# Patient Record
Sex: Female | Born: 2005 | Race: Black or African American | Hispanic: No | Marital: Single | Smoking: Never smoker
Health system: Southern US, Community
[De-identification: ages and names within clinical notes are randomized; demographics above are authoritative.]

## PROBLEM LIST (undated history)

## (undated) DIAGNOSIS — R198 Other specified symptoms and signs involving the digestive system and abdomen: Secondary | ICD-10-CM

## (undated) DIAGNOSIS — R0981 Nasal congestion: Secondary | ICD-10-CM

## (undated) DIAGNOSIS — R1310 Dysphagia, unspecified: Secondary | ICD-10-CM

## (undated) DIAGNOSIS — R05 Cough: Secondary | ICD-10-CM

## (undated) DIAGNOSIS — E669 Obesity, unspecified: Secondary | ICD-10-CM

## (undated) DIAGNOSIS — E119 Type 2 diabetes mellitus without complications: Secondary | ICD-10-CM

## (undated) DIAGNOSIS — J353 Hypertrophy of tonsils with hypertrophy of adenoids: Secondary | ICD-10-CM

## (undated) HISTORY — DX: Type 2 diabetes mellitus without complications: E11.9

---
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
## 2023-01-27 ENCOUNTER — Telehealth: Payer: Self-pay

## 2023-01-27 NOTE — Telephone Encounter (Signed)
Spoke with patient, reschedule New Patient Appt thru [facility] website @[facility]  for 03/14/2023. I will be calling her tomorrow to make OBGYN New Appt.

## 2023-01-28 ENCOUNTER — Telehealth: Payer: Self-pay

## 2023-01-28 NOTE — Telephone Encounter (Signed)
I had called [facility] before calling patient. Find out that on Fridays the close at noon. Call patient to reschedule a time to call them, we agreed that I would call her Monday to try again and schedule NP appt.

## 2023-02-08 ENCOUNTER — Telehealth: Payer: Self-pay

## 2023-02-08 NOTE — Telephone Encounter (Signed)
Attempted to call patient to let her know I was able to make a New Patient Appt at [facility] for her OBGYN Care. Appt on 03/15/2023 at 3:10 pm with Dr. Charlotta Newton (female)

## 2023-02-08 NOTE — Telephone Encounter (Signed)
Patient called back, notify her that I was able to make a New Patient Appt at [facility] for her OBGYN Care with Dr. Myna Hidalgo on 03/15/2023 at 3:10 pm. I informed the importance of this appt., that since this is the appt that will establish her care with Dr. Charlotta Newton so that whenever she has questions, health concerns, or any symptom that she can consult them thru MyChart or by phone. Patient agree and stress her interest in keeping both appt. Pt was very thankful and agree to received a follow up call in two weeks.

## 2023-02-28 ENCOUNTER — Emergency Department (HOSPITAL_COMMUNITY)
Admission: EM | Admit: 2023-02-28 | Discharge: 2023-03-01 | Disposition: A | Discharge status: Home / Self Care | Payer: Medicaid Other | Attending: Emergency Medicine | Admitting: Emergency Medicine

## 2023-02-28 ENCOUNTER — Other Ambulatory Visit: Payer: Self-pay

## 2023-02-28 ENCOUNTER — Ambulatory Visit
Admission: EM | Admit: 2023-02-28 | Discharge: 2023-02-28 | Disposition: A | Discharge status: Home / Self Care | Payer: Medicaid Other | Attending: Nurse Practitioner | Admitting: Nurse Practitioner

## 2023-02-28 DIAGNOSIS — E119 Type 2 diabetes mellitus without complications: Secondary | ICD-10-CM | POA: Insufficient documentation

## 2023-02-28 DIAGNOSIS — Z7984 Long term (current) use of oral hypoglycemic drugs: Secondary | ICD-10-CM | POA: Diagnosis not present

## 2023-02-28 DIAGNOSIS — I880 Nonspecific mesenteric lymphadenitis: Secondary | ICD-10-CM

## 2023-02-28 DIAGNOSIS — R109 Unspecified abdominal pain: Secondary | ICD-10-CM

## 2023-02-28 DIAGNOSIS — N3289 Other specified disorders of bladder: Secondary | ICD-10-CM | POA: Diagnosis not present

## 2023-02-28 LAB — POCT URINALYSIS DIP (MANUAL ENTRY)
Bilirubin, UA: NEGATIVE
Blood, UA: NEGATIVE
Glucose, UA: NEGATIVE mg/dL
Ketones, POC UA: NEGATIVE mg/dL
Leukocytes, UA: NEGATIVE
Nitrite, UA: NEGATIVE
Protein Ur, POC: NEGATIVE mg/dL
Spec Grav, UA: >=1.03 — AB
Urobilinogen, UA: 0.2 U/dL
pH, UA: 6

## 2023-02-28 NOTE — ED Provider Notes (Signed)
[facility]    CSN: [id number] Arrival date & time: 02/28/23  1824      History   Chief Complaint Chief Complaint  Patient presents with   Abdominal Pain    HPI Christina Dixon is a 17 y.o. female.   Patient presents today for pain in her right lower quadrant this been ongoing since this morning when she bent down to pick up a rag in the shower.  Reports the pain is severe and constant.  She has been getting her hair done all day and had to frequently stand up and move around to help with the belly pain.  She reports the belly pain has worsened throughout the day and is currently a 10 out of 10, worst pain of her life.  She reports only time she had similar pain is when she had acute appendicitis and had to have her appendix removed.    Past Medical History:  Diagnosis Date   Cough 05/28/2014   Diabetes mellitus without complication (HCC)    diet controlled   Difficulty swallowing pills    Obesity    Stuffy nose 05/28/2014   Tonsillar and adenoid hypertrophy 05/2014   snores during sleep, occasionally stops breathing, per mother    Patient Active Problem List   Diagnosis Date Noted   Irregular menstrual cycle 11/05/2019   S/P laparoscopic appendectomy 08/08/2017   Acute appendicitis    Prediabetes 01/24/2015   Adjustment disorder with depressed mood 11/21/2014   Severe obesity due to excess calories without serious comorbidity with body mass index (BMI) greater than 99th percentile for age in pediatric patient (HCC) 10/17/2014   Acanthosis nigricans 10/17/2014   Dyspepsia 10/17/2014   Insulin resistance 10/17/2014   Mixed hyperlipidemia 10/17/2014   Hyperinsulinemia 10/04/2014    Past Surgical History:  Procedure Laterality Date   LAPAROSCOPIC APPENDECTOMY N/A 08/08/2017   Procedure: APPENDECTOMY LAPAROSCOPIC;  Surgeon: Franky Macho, MD;  Location: [facility];  Service: General;  Laterality: N/A;   TONSILLECTOMY AND ADENOIDECTOMY Bilateral 06/04/2014    Procedure: BILATERAL TONSILLECTOMY AND ADENOIDECTOMY;  Surgeon: Newman Pies, MD;  Location: [facility];  Service: ENT;  Laterality: Bilateral;    OB History   No obstetric history on file.      Home Medications    Prior to Admission medications   Medication Sig Start Date End Date Taking? Authorizing Provider  chlorhexidine (HIBICLENS) 4 % external liquid Apply topically daily as needed. Patient not taking: Reported on 03/26/2022 12/17/20   Particia Nearing, PA-C  cyclobenzaprine (FLEXERIL) 10 MG tablet Take 1 tablet (10 mg total) by mouth 2 (two) times daily as needed for muscle spasms. Patient not taking: Reported on 03/26/2022 07/01/20   Wurst, [place], PA-C  doxycycline (VIBRA-TABS) 100 MG tablet Take 1 tablet (100 mg total) by mouth 2 (two) times daily. 06/27/22   Kommor, [place], MD  guaiFENesin-dextromethorphan [facility] DM) 100-10 MG/5ML syrup Take 5 mLs by mouth every 4 (four) hours as needed for cough. Patient not taking: Reported on 03/26/2022 03/20/22   Gareth Eagle, PA-C  meloxicam (MOBIC) 15 MG tablet Take 1 tablet (15 mg total) by mouth daily. Patient not taking: Reported on 03/26/2022 07/01/20   Wurst, [place], PA-C  metFORMIN (GLUCOPHAGE) 500 MG tablet Take 1 tablet (500 mg total) by mouth 2 (two) times daily with a meal. 12/19/19 01/18/20  Vella Kohler, MD  metroNIDAZOLE (FLAGYL) 500 MG tablet Take 1 tablet (500 mg total) by mouth 2 (two) times daily. 06/27/22  Kommor, [place], MD    Family History Family History  Problem Relation Age of Onset   Diabetes Father    Hypertension Father    Asthma Father    Seizures Father    Hypertension Maternal Grandmother    Hypertension Maternal Grandfather    Diabetes Paternal Grandfather    Hypertension Paternal Grandfather    Depression Mother    Anxiety disorder Mother    Hypertension Paternal Grandmother    Asthma Brother    Migraines Brother    Migraines Maternal Aunt    Bipolar disorder Neg Hx     Schizophrenia Neg Hx    ADD / ADHD Neg Hx    Autism Neg Hx     Social History Social History   Tobacco Use   Smoking status: Passive Smoke Exposure - Never Smoker   Smokeless tobacco: Never   Tobacco comments:    mother smokes outside most of the time  Vaping Use   Vaping status: Never Used  Substance Use Topics   Alcohol use: No   Drug use: No     Allergies   Patient has no known allergies.   Review of Systems Review of Systems Per HPI  Physical Exam Triage Vital Signs ED Triage Vitals  Encounter Vitals Group     BP 02/28/23 1910 112/72     Systolic BP Percentile --      Diastolic BP Percentile --      Pulse Rate 02/28/23 1910 (!) 59     Resp 02/28/23 1910 16     Temp 02/28/23 1910 98.1 F (36.7 C)     Temp Source 02/28/23 1910 Oral     SpO2 02/28/23 1910 98 %     Weight --      Height --      Head Circumference --      Peak Flow --      Pain Score 02/28/23 1911 10     Pain Loc --      Pain Education --      Exclude from Growth Chart --    No data found.  Updated Vital Signs BP 112/72 (BP Location: Right Arm)   Pulse (!) 59   Temp 98.1 F (36.7 C) (Oral)   Resp 16   LMP 02/24/2023 (Exact Date)   SpO2 98%   Visual Acuity Right Eye Distance:   Left Eye Distance:   Bilateral Distance:    Right Eye Near:   Left Eye Near:    Bilateral Near:     Physical Exam Vitals and nursing note reviewed.  Constitutional:      General: She is not in acute distress.    Appearance: Normal appearance. She is not toxic-appearing.  HENT:     Head: Normocephalic and atraumatic.     Mouth/Throat:     Mouth: Mucous membranes are moist.     Pharynx: Oropharynx is clear.  Eyes:     General: No scleral icterus.    Extraocular Movements: Extraocular movements intact.  Cardiovascular:     Rate and Rhythm: Bradycardia present.  Pulmonary:     Effort: Pulmonary effort is normal. No respiratory distress.     Breath sounds: Normal breath sounds. No rhonchi.   Abdominal:     General: Bowel sounds are normal.  Musculoskeletal:     Cervical back: Normal range of motion.  Lymphadenopathy:     Cervical: No cervical adenopathy.  Skin:    General: Skin is warm and dry.  Capillary Refill: Capillary refill takes less than 2 seconds.     Coloration: Skin is not jaundiced or pale.     Findings: No erythema.  Neurological:     Mental Status: She is alert and oriented to person, place, and time.  Psychiatric:        Mood and Affect: Affect is tearful.        Behavior: Behavior is cooperative.    Examination abbreviated as decision was made for patient to be seen emergently.  UC Treatments / Results  Labs (all labs ordered are listed, but only abnormal results are displayed) Labs Reviewed  POCT URINALYSIS DIP (MANUAL ENTRY) - Abnormal; Notable for the following components:      Result Value   Spec Grav, UA >=1.030 (*)    All other components within normal limits    EKG   Radiology No results found.  Procedures Procedures (including critical care time)  Medications Ordered in UC Medications - No data to display  Initial Impression / Assessment and Plan / UC Course  I have reviewed the triage vital signs and the nursing notes.  Pertinent labs & imaging results that were available during my care of the patient were reviewed by me and considered in my medical decision making (see chart for details).   Patient is well-appearing, normotensive, afebrile, not tachycardic, not tachypneic, oxygenating well on room air.    1. Sudden onset of severe abdominal pain Overall, vitals are stable Given severity of pain worsening throughout the day, I recommended further evaluation and management in emergency room Urinalysis is negative for signs of bladder or kidney stone Patient is in agreement to plan She is safe to transport via private vehicle at this time  The patient was given the opportunity to ask questions.  All questions answered to  their satisfaction.  The patient is in agreement to this plan.   Final Clinical Impressions(s) / UC Diagnoses   Final diagnoses:  Sudden onset of severe abdominal pain     Discharge Instructions      Please go directly to the emergency room for further evaluation and management of your abdominal pain.    ED Prescriptions   None    PDMP not reviewed this encounter.   Valentino Nose, NP 02/28/23 [phone]

## 2023-02-28 NOTE — Discharge Instructions (Signed)
Please go directly to the emergency room for further evaluation and management of your abdominal pain.

## 2023-02-28 NOTE — ED Notes (Signed)
Patient is being discharged from the Urgent Care and sent to the Emergency Department via [facility] . Per provider Anderson Malta., patient is in need of higher level of care due to abdominal pain needing further testing. Patient is aware and verbalizes understanding of plan of care.  Vitals:   02/28/23 1910  BP: 112/72  Pulse: (!) 59  Resp: 16  Temp: 98.1 F (36.7 C)  SpO2: 98%

## 2023-02-28 NOTE — ED Triage Notes (Signed)
Pt states she is having pain in RLQ the goes to her back that started this morning. Pt states the pain is worse now and having nausea and vomiting. Taking tylenol and ibuprofen.

## 2023-02-28 NOTE — ED Triage Notes (Signed)
Pt c/o right flank pain that began this morning, states the pain has gotten worse and now having N/V. Was seen at [facility] earlier tonight and was instructed to come to ED to be evaluated.

## 2023-03-01 ENCOUNTER — Emergency Department (HOSPITAL_COMMUNITY): Payer: Medicaid Other

## 2023-03-01 DIAGNOSIS — N3289 Other specified disorders of bladder: Secondary | ICD-10-CM | POA: Diagnosis not present

## 2023-03-01 DIAGNOSIS — R109 Unspecified abdominal pain: Secondary | ICD-10-CM | POA: Diagnosis not present

## 2023-03-01 LAB — URINALYSIS, ROUTINE W REFLEX MICROSCOPIC
Bilirubin Urine: NEGATIVE
Glucose, UA: NEGATIVE mg/dL
Hgb urine dipstick: NEGATIVE
Ketones, ur: NEGATIVE mg/dL
Nitrite: NEGATIVE
Protein, ur: 30 mg/dL — AB
Specific Gravity, Urine: 1.03 (ref 1.005–1.030)
pH: 5 (ref 5.0–8.0)

## 2023-03-01 LAB — BASIC METABOLIC PANEL
Anion gap: 9 (ref 5–15)
BUN: 13 mg/dL (ref 6–20)
CO2: 24 mmol/L (ref 22–32)
Calcium: 8.6 mg/dL — ABNORMAL LOW (ref 8.9–10.3)
Chloride: 105 mmol/L (ref 98–111)
Creatinine, Ser: 1.14 mg/dL — ABNORMAL HIGH (ref 0.44–1.00)
GFR, Estimated: >60 mL/min (ref >60)
Glucose, Bld: 98 mg/dL (ref 70–99)
Potassium: 3.9 mmol/L (ref 3.5–5.1)
Sodium: 138 mmol/L (ref 135–145)

## 2023-03-01 LAB — CBC
HCT: 43.1 % (ref 36.0–46.0)
Hemoglobin: 13.9 g/dL (ref 12.0–15.0)
MCH: 28.6 pg (ref 26.0–34.0)
MCHC: 32.3 g/dL (ref 30.0–36.0)
MCV: 88.7 fL (ref 80.0–100.0)
Platelets: 257 10*3/uL (ref 150–400)
RBC: 4.86 MIL/uL (ref 3.87–5.11)
RDW: 13.1 % (ref 11.5–15.5)
WBC: 8.4 10*3/uL (ref 4.0–10.5)
nRBC: 0 % (ref 0.0–0.2)

## 2023-03-01 LAB — PREGNANCY, URINE: Preg Test, Ur: NEGATIVE

## 2023-03-01 MED ORDER — HYDROCODONE-ACETAMINOPHEN 5-325 MG PO TABS
1.0000 | ORAL_TABLET | Freq: Once | ORAL | Status: AC
  Administered 2023-03-01: 1 via ORAL
  Filled 2023-03-01: qty 1

## 2023-03-01 MED ORDER — HYDROCODONE-ACETAMINOPHEN 5-325 MG PO TABS: 1.0000 | ORAL_TABLET | Freq: Four times a day (QID) | ORAL | 0 refills | Status: DC | PRN

## 2023-03-01 MED ORDER — ONDANSETRON 8 MG PO TBDP
8.0000 mg | ORAL_TABLET | Freq: Once | ORAL | Status: AC
  Administered 2023-03-01: 8 mg via ORAL
  Filled 2023-03-01: qty 1

## 2023-03-01 MED ORDER — KETOROLAC TROMETHAMINE 15 MG/ML IJ SOLN
15.0000 mg | Freq: Once | INTRAMUSCULAR | Status: AC
  Administered 2023-03-01: 15 mg via INTRAVENOUS
  Filled 2023-03-01: qty 1

## 2023-03-01 NOTE — ED Notes (Signed)
ED Provider at bedside. 

## 2023-03-01 NOTE — ED Provider Notes (Signed)
[place] [facility] AT [facility] Provider Note   CSN: [id number] Arrival date & time: 02/28/23  2338     History  Chief Complaint  Patient presents with   Flank Pain    Christina Dixon is a 17 y.o. female.  The history is provided by the patient.  Flank Pain  Patient with history of prediabetes and obesity presents with flank and abdominal pain.  Patient reports this started in the morning & has been worsening.  It is in the right lower quadrant and into her right flank.  She reports that is very severe.  She has had episodes of nausea vomiting and diarrhea.  She reports dysuria.  No vaginal bleeding or discharge.  Previous history of appendectomy    Past Medical History:  Diagnosis Date   Cough 05/28/2014   Diabetes mellitus without complication (HCC)    diet controlled   Difficulty swallowing pills    Obesity    Stuffy nose 05/28/2014   Tonsillar and adenoid hypertrophy 05/2014   snores during sleep, occasionally stops breathing, per mother    Home Medications Prior to Admission medications   Medication Sig Start Date End Date Taking? Authorizing Provider  HYDROcodone-acetaminophen (NORCO/VICODIN) 5-325 MG tablet Take 1 tablet by mouth every 6 (six) hours as needed. 03/01/23  Yes Zadie Rhine, MD  metFORMIN (GLUCOPHAGE) 500 MG tablet Take 1 tablet (500 mg total) by mouth 2 (two) times daily with a meal. 12/19/19 01/18/20  Vella Kohler, MD      Allergies    Patient has no known allergies.    Review of Systems   Review of Systems  Constitutional:  Negative for fever.  Gastrointestinal:  Positive for diarrhea, nausea and vomiting.  Genitourinary:  Positive for dysuria and flank pain. Negative for vaginal bleeding and vaginal discharge.    Physical Exam Updated Vital Signs BP 108/64   Pulse 63   Temp 97.6 F (36.4 C) (Oral)   Resp 18   LMP 02/24/2023 (Exact Date)   SpO2 100%  Physical Exam CONSTITUTIONAL: Well developed/well  nourished HEAD: Normocephalic/atraumatic EYES: EOMI ENMT: Mucous membranes moist NECK: supple no meningeal signs CV: S1/S2 noted, no murmurs/rubs/gallops noted LUNGS: Lungs are clear to auscultation bilaterally, no apparent distress ABDOMEN: soft, mild RLQ tenderness, no rebound or guarding, bowel sounds noted throughout abdomen GU: Right cva tenderness NEURO: Pt is awake/alert/appropriate, moves all extremitiesx4.  No facial droop.   EXTREMITIES: pulses normal/equal, full ROM SKIN: warm, color normal PSYCH: no abnormalities of mood noted, alert and oriented to situation  ED Results / Procedures / Treatments   Labs (all labs ordered are listed, but only abnormal results are displayed) Labs Reviewed  BASIC METABOLIC PANEL - Abnormal; Notable for the following components:      Result Value   Creatinine, Ser 1.14 (*)    Calcium 8.6 (*)    All other components within normal limits  URINALYSIS, ROUTINE W REFLEX MICROSCOPIC - Abnormal; Notable for the following components:   APPearance TURBID (*)    Protein, ur 30 (*)    Leukocytes,Ua TRACE (*)    Bacteria, UA MANY (*)    All other components within normal limits  CBC  PREGNANCY, URINE    EKG None  Radiology CT Renal Stone Study Result Date: 03/01/2023 CLINICAL DATA:  Right flank pain. EXAM: CT ABDOMEN AND PELVIS WITHOUT CONTRAST TECHNIQUE: Multidetector CT imaging of the abdomen and pelvis was performed following the standard protocol without IV contrast. RADIATION DOSE REDUCTION:  This exam was performed according to the departmental dose-optimization program which includes automated exposure control, adjustment of the mA and/or kV according to patient size and/or use of iterative reconstruction technique. COMPARISON:  August 08, 2017 FINDINGS: Lower chest: No acute abnormality. Hepatobiliary: No focal liver abnormality is seen. No gallstones, gallbladder wall thickening, or biliary dilatation. Pancreas: Unremarkable. No pancreatic  ductal dilatation or surrounding inflammatory changes. Spleen: Normal in size without focal abnormality. Adrenals/Urinary Tract: Adrenal glands are unremarkable. Kidneys are normal, without renal calculi, focal lesion, or hydronephrosis. The urinary bladder is poorly distended and subsequently limited in evaluation. Stomach/Bowel: Stomach is within normal limits. The appendix is surgically absent. No evidence of bowel wall thickening, distention, or inflammatory changes. Vascular/Lymphatic: No significant vascular findings are present. Numerous tiny mesenteric lymph nodes are seen scattered throughout the abdomen. Reproductive: Uterus and bilateral adnexa are unremarkable. Other: No abdominal wall hernia or abnormality. No abdominopelvic ascites. Musculoskeletal: No acute or significant osseous findings. IMPRESSION: 1. Numerous tiny mesenteric lymph nodes scattered throughout the abdomen, which may represent sequelae associated with mesenteric adenitis. 2. Evidence of prior appendectomy. Electronically Signed   By: Aram Candela M.D.   On: 03/01/2023 02:13    Procedures Procedures    Medications Ordered in ED Medications  ketorolac (TORADOL) 15 MG/ML injection 15 mg (has no administration in time range)  ondansetron (ZOFRAN-ODT) disintegrating tablet 8 mg (8 mg Oral Given 03/01/23 0030)  HYDROcodone-acetaminophen (NORCO/VICODIN) 5-325 MG per tablet 1 tablet (1 tablet Oral Given 03/01/23 0030)    ED Course/ Medical Decision Making/ A&P Clinical Course as of 03/01/23 0257  Tue Mar 01, 2023  0256 Patient presented for flank and abdominal pain.  Overall labs were unremarkable CT imaging reveals mesenteric adenitis.  Patient has been resting comfortably. Patient safe for discharge home.  We discussed CT findings and will provide short course of pain medicine [DW]    Clinical Course User Index [DW] Zadie Rhine, MD                                 Medical Decision Making Amount and/or  Complexity of Data Reviewed Labs: ordered. Radiology: ordered.  Risk Prescription drug management.   This patient presents to the ED for concern of flank and abdominal pain, this involves an extensive number of treatment options, and is a complaint that carries with it a high risk of complications and morbidity.  The differential diagnosis includes but is not limited to cholecystitis, cholelithiasis, pancreatitis, gastritis, peptic ulcer disease, , bowel obstruction, bowel perforation, diverticulitis, TOA, PID, ectopic pregnancy, kidney stone, UTI    Comorbidities that complicate the patient evaluation: Patient's presentation is complicated by their history of obesity and diabetes  Additional history obtained: Records reviewed  urgent care notes reviewed  Lab Tests: I Ordered, and personally interpreted labs.  The pertinent results include: Labs overall unremarkable  Imaging Studies ordered: I ordered imaging studies including CT scan renal   I independently visualized and interpreted imaging which showed mesenteric adenitis I agree with the radiologist interpretation   Medicines ordered and prescription drug management: I ordered medication including Vicodin for pain Reevaluation of the patient after these medicines showed that the patient    improved   Reevaluation: After the interventions noted above, I reevaluated the patient and found that they have :improved  Complexity of problems addressed: Patient's presentation is most consistent with  acute presentation with potential threat to life or bodily function  Disposition: After consideration of the diagnostic results and the patient's response to treatment,  I feel that the patent would benefit from discharge   .           Final Clinical Impression(s) / ED Diagnoses Final diagnoses:  Mesenteric adenitis    Rx / DC Orders ED Discharge Orders          Ordered    HYDROcodone-acetaminophen (NORCO/VICODIN)  5-325 MG tablet  Every 6 hours PRN        03/01/23 0255              Zadie Rhine, MD 03/01/23 [phone]

## 2023-03-14 ENCOUNTER — Ambulatory Visit: Payer: Medicaid Other | Admitting: Family Medicine

## 2023-03-15 ENCOUNTER — Encounter: Payer: Medicaid Other | Admitting: Obstetrics & Gynecology

## 2023-03-25 ENCOUNTER — Ambulatory Visit
Admission: EM | Admit: 2023-03-25 | Discharge: 2023-03-25 | Disposition: A | Discharge status: Home / Self Care | Payer: Medicaid Other | Attending: Nurse Practitioner | Admitting: Nurse Practitioner

## 2023-03-25 ENCOUNTER — Encounter: Payer: Self-pay | Admitting: Family Medicine

## 2023-03-25 ENCOUNTER — Emergency Department (HOSPITAL_COMMUNITY)
Admission: EM | Admit: 2023-03-25 | Discharge: 2023-03-25 | Discharge status: Against Medical Advice | Payer: Medicaid Other | Source: Home / Self Care

## 2023-03-25 DIAGNOSIS — M25562 Pain in left knee: Secondary | ICD-10-CM | POA: Diagnosis not present

## 2023-03-25 LAB — POCT URINE PREGNANCY: Preg Test, Ur: NEGATIVE

## 2023-03-25 NOTE — ED Notes (Addendum)
Urine sample obtained. POC Urinalysis order entered by clinical staff in error. POC Urinalysis not completed and order discontinued. Provider aware and give verbal order for POC urine pregnancy only.

## 2023-03-25 NOTE — Discharge Instructions (Signed)
Go to [facility] for an x-ray of your left knee.You will be contacted when the results are received.  May take Ibuprofen or Tylenol as needed for pain or discomfort. RICE therapy, rest, ice, compression and elevation. Apply ice for 20 minutes, remove for 1 hr, repeat as needed. If the xray is negative and you care continuing to have pain in your knee, it is recommended you follow-up with orthopedics for further evaluation.  Follow-up as needed.

## 2023-03-25 NOTE — ED Triage Notes (Signed)
Pt reports she turned the wrong way and heard her left leg make a sound and couldn't move it x 3 days

## 2023-03-25 NOTE — ED Provider Notes (Signed)
[facility]    CSN: [id number] Arrival date & time: 03/25/23  1155      History   Chief Complaint No chief complaint on file.   HPI Christina Dixon is a 18 y.o. female.   The history is provided by the patient.   Patient presents for complaints of left knee pain has been present for the past 3 days.  She states that she turned the wrong way and heard a "pop" in her knee.  She states that she has had difficulty moving the leg as she has pain when she straightens the leg and when she bends it.  She states that when she turns the leg a certain way, she has pain as well.  Pain is located on the outside of the left knee.  She denies redness, swelling, bruising, or the inability to ambulate.  Patient states that the pain has started radiating down into her right lower leg.  She states that she did take ibuprofen with minimal relief of her symptoms.  Past Medical History:  Diagnosis Date   Cough 05/28/2014   Diabetes mellitus without complication (HCC)    diet controlled   Difficulty swallowing pills    Obesity    Stuffy nose 05/28/2014   Tonsillar and adenoid hypertrophy 05/2014   snores during sleep, occasionally stops breathing, per mother    Patient Active Problem List   Diagnosis Date Noted   Irregular menstrual cycle 11/05/2019   S/P laparoscopic appendectomy 08/08/2017   Acute appendicitis    Prediabetes 01/24/2015   Adjustment disorder with depressed mood 11/21/2014   Severe obesity due to excess calories without serious comorbidity with body mass index (BMI) greater than 99th percentile for age in pediatric patient (HCC) 10/17/2014   Acanthosis nigricans 10/17/2014   Dyspepsia 10/17/2014   Insulin resistance 10/17/2014   Mixed hyperlipidemia 10/17/2014   Hyperinsulinemia 10/04/2014    Past Surgical History:  Procedure Laterality Date   LAPAROSCOPIC APPENDECTOMY N/A 08/08/2017   Procedure: APPENDECTOMY LAPAROSCOPIC;  Surgeon: Franky Macho, MD;  Location:  [facility];  Service: General;  Laterality: N/A;   TONSILLECTOMY AND ADENOIDECTOMY Bilateral 06/04/2014   Procedure: BILATERAL TONSILLECTOMY AND ADENOIDECTOMY;  Surgeon: Newman Pies, MD;  Location: [facility];  Service: ENT;  Laterality: Bilateral;    OB History   No obstetric history on file.      Home Medications    Prior to Admission medications   Medication Sig Start Date End Date Taking? Authorizing Provider  HYDROcodone-acetaminophen (NORCO/VICODIN) 5-325 MG tablet Take 1 tablet by mouth every 6 (six) hours as needed. 03/01/23   Zadie Rhine, MD  metFORMIN (GLUCOPHAGE) 500 MG tablet Take 1 tablet (500 mg total) by mouth 2 (two) times daily with a meal. 12/19/19 01/18/20  Vella Kohler, MD    Family History Family History  Problem Relation Age of Onset   Diabetes Father    Hypertension Father    Asthma Father    Seizures Father    Hypertension Maternal Grandmother    Hypertension Maternal Grandfather    Diabetes Paternal Grandfather    Hypertension Paternal Grandfather    Depression Mother    Anxiety disorder Mother    Hypertension Paternal Grandmother    Asthma Brother    Migraines Brother    Migraines Maternal Aunt    Bipolar disorder Neg Hx    Schizophrenia Neg Hx    ADD / ADHD Neg Hx    Autism Neg Hx     Social  History Social History   Tobacco Use   Smoking status: Passive Smoke Exposure - Never Smoker   Smokeless tobacco: Never   Tobacco comments:    mother smokes outside most of the time  Vaping Use   Vaping status: Never Used  Substance Use Topics   Alcohol use: No   Drug use: No     Allergies   Patient has no known allergies.   Review of Systems Review of Systems Per HPI  Physical Exam Triage Vital Signs ED Triage Vitals [03/25/23 1252]  Encounter Vitals Group     BP 100/70     Systolic BP Percentile      Diastolic BP Percentile      Pulse Rate 81     Resp 20     Temp 98.3 F (36.8 C)     Temp Source Oral     SpO2  94 %     Weight      Height      Head Circumference      Peak Flow      Pain Score 6     Pain Loc      Pain Education      Exclude from Growth Chart    No data found.  Updated Vital Signs BP 100/70 (BP Location: Right Arm)   Pulse 81   Temp 98.3 F (36.8 C) (Oral)   Resp 20   LMP 03/23/2023 (Exact Date)   SpO2 94%   Visual Acuity Right Eye Distance:   Left Eye Distance:   Bilateral Distance:    Right Eye Near:   Left Eye Near:    Bilateral Near:     Physical Exam Vitals and nursing note reviewed.  Constitutional:      General: She is not in acute distress.    Appearance: Normal appearance.  HENT:     Head: Normocephalic.  Eyes:     Extraocular Movements: Extraocular movements intact.     Pupils: Pupils are equal, round, and reactive to light.  Pulmonary:     Effort: Pulmonary effort is normal.  Musculoskeletal:     Cervical back: Normal range of motion.     Left knee: No swelling, deformity, effusion, erythema, ecchymosis or bony tenderness. Decreased range of motion. Tenderness present over the lateral joint line. Normal pulse.  Skin:    General: Skin is warm.  Neurological:     General: No focal deficit present.     Mental Status: She is alert and oriented to person, place, and time.  Psychiatric:        Mood and Affect: Mood normal.        Behavior: Behavior normal.      UC Treatments / Results  Labs (all labs ordered are listed, but only abnormal results are displayed) Labs Reviewed  POCT URINE PREGNANCY    EKG   Radiology No results found.  Procedures Procedures (including critical care time)  Medications Ordered in UC Medications - No data to display  Initial Impression / Assessment and Plan / UC Course  I have reviewed the triage vital signs and the nursing notes.  Pertinent labs & imaging results that were available during my care of the patient were reviewed by me and considered in my medical decision making (see chart for  details).  X-ray of the left knee is pending.  Patient to go to [facility] for imaging.  Cannot rule out meniscal injury given the patient's current symptoms, did advise patient that  she would need further imaging such as an MRI for a true determination.  Will rule out fracture or dislocation at this time.  Hinged knee brace was provided to the patient to provide additional support and stabilization of the left lower extremity.  Supportive care recommendations were provided and discussed with the patient to.  RICE therapy, and over-the-counter analgesics.  Advised patient if symptoms continue to persist despite a negative x-ray, recommend that she follow-up with orthopedics for further evaluation.  Patient was in agreement with this plan of care and verbalized understanding.  All questions were answered.  Patient stable for discharge.   Final Clinical Impressions(s) / UC Diagnoses   Final diagnoses:  Left knee pain, unspecified chronicity     Discharge Instructions      Go to [facility] for an x-ray of your left knee.You will be contacted when the results are received.  May take Ibuprofen or Tylenol as needed for pain or discomfort. RICE therapy, rest, ice, compression and elevation. Apply ice for 20 minutes, remove for 1 hr, repeat as needed. If the xray is negative and you care continuing to have pain in your knee, it is recommended you follow-up with orthopedics for further evaluation.  Follow-up as needed.      ED Prescriptions   None    PDMP not reviewed this encounter.   Abran Cantor, NP 03/25/23 1342

## 2023-04-05 ENCOUNTER — Ambulatory Visit: Payer: Medicaid Other | Admitting: Obstetrics & Gynecology

## 2023-04-05 ENCOUNTER — Other Ambulatory Visit (HOSPITAL_COMMUNITY)
Admission: RE | Admit: 2023-04-05 | Discharge: 2023-04-05 | Disposition: A | Discharge status: Home / Self Care | Payer: Medicaid Other | Source: Ambulatory Visit | Attending: Obstetrics & Gynecology | Admitting: Obstetrics & Gynecology

## 2023-04-05 ENCOUNTER — Encounter: Payer: Self-pay | Admitting: Obstetrics & Gynecology

## 2023-04-05 VITALS — BP 97/58 | HR 71 | Ht 68.0 in | Wt 324.0 lb

## 2023-04-05 DIAGNOSIS — R7303 Prediabetes: Secondary | ICD-10-CM

## 2023-04-05 DIAGNOSIS — Z113 Encounter for screening for infections with a predominantly sexual mode of transmission: Secondary | ICD-10-CM | POA: Insufficient documentation

## 2023-04-05 DIAGNOSIS — Z6841 Body Mass Index (BMI) 40.0 and over, adult: Secondary | ICD-10-CM | POA: Diagnosis not present

## 2023-04-05 DIAGNOSIS — Z3009 Encounter for other general counseling and advice on contraception: Secondary | ICD-10-CM

## 2023-04-05 NOTE — Progress Notes (Signed)
   GYN VISIT Patient name: Christina Dixon MRN [id number]  Date of birth: [birth year] Chief Complaint:   Establish Care  History of Present Illness:   Christina Dixon is a 18 y.o. G0P0000 female being seen today to establish care and the following concerns.     Menses are every month- last for about 5 days.  Denies intermenstrual bleeding.  Sexually active with same partner for the past couple months and desires a pregnancy.  She does not report a pregnancy in the past  ?PID- h/o Trich and Chlamydia.  She was treated then tested positive again for trichomonas and retreated.  She has not been sexually active with that prior partner []  desires TOC today  Patient notes history of prediabetes   Patient's last menstrual period was 03/23/2023 (exact date).    Review of Systems:   Pertinent items are noted in HPI Denies fever/chills, dizziness, headaches, visual disturbances, fatigue, shortness of breath, chest pain, abdominal pain, vomiting, no problems with periods, bowel movements, urination, or intercourse unless otherwise stated above.  Pertinent History Reviewed:   Past Surgical History:  Procedure Laterality Date   LAPAROSCOPIC APPENDECTOMY N/A 08/08/2017   Procedure: APPENDECTOMY LAPAROSCOPIC;  Surgeon: Franky Macho, MD;  Location: [facility];  Service: General;  Laterality: N/A;   TONSILLECTOMY AND ADENOIDECTOMY Bilateral 06/04/2014   Procedure: BILATERAL TONSILLECTOMY AND ADENOIDECTOMY;  Surgeon: Newman Pies, MD;  Location: [facility];  Service: ENT;  Laterality: Bilateral;    Past Medical History:  Diagnosis Date   Cough 05/28/2014   Diabetes mellitus without complication (HCC)    diet controlled   Difficulty swallowing pills    Obesity    Stuffy nose 05/28/2014   Tonsillar and adenoid hypertrophy 05/2014   snores during sleep, occasionally stops breathing, per mother   Reviewed problem list, medications and allergies. Physical Assessment:   Vitals:   04/05/23  1448  BP: (!) 97/58  Pulse: 71  Weight: (!) 324 lb (147 kg)  Height: 5\' 8"  (1.727 m)  Body mass index is 49.26 kg/m.       Physical Examination:   General appearance: alert, well appearing, and in no distress  Psych: mood appropriate, normal affect  Skin: warm & dry   Cardiovascular: normal heart rate noted  Respiratory: normal respiratory effort, no distress  Abdomen: obese, soft, non-tender   Pelvic: VULVA: normal appearing vulva with no masses, tenderness or lesions, VAGINA: normal appearing vagina with normal color frothy white discharge noted, no lesions, CERVIX: normal appearing cervix without discharge or lesions, UTERUS: uterus is normal size, shape, consistency and nontender, ADNEXA: normal adnexa in size, nontender and no masses-examination limited due to body habitus  Extremities: no edema   Chaperone: Faith Rogue    Assessment & Plan:  1) Family planning -Encouraged healthy lifestyle choices -PNV daily -Discussed fecundity and advised menses tracking, should she not conceive in the next year we will plan for full workup at that time  2) STI screening -Full workup to be completed  3) PreDM, morbid obesity -Recheck A1c today -Discussed that diabetes may be a factor limiting her pregnancy again reviewed importance of healthy diet   Orders Placed This Encounter  Procedures   HIV Antibody (routine testing w rflx)   RPR   HgB A1c    Return for Annual.   Myna Hidalgo, DO Attending Obstetrician & Gynecologist, [facility] for [facility], [facility]

## 2023-04-06 LAB — HIV ANTIBODY (ROUTINE TESTING W REFLEX): HIV Screen 4th Generation wRfx: NONREACTIVE

## 2023-04-06 LAB — HEMOGLOBIN A1C
Est. average glucose Bld gHb Est-mCnc: 111 mg/dL
Hgb A1c MFr Bld: 5.5 % (ref 4.8–5.6)

## 2023-04-06 LAB — RPR: RPR Ser Ql: NONREACTIVE

## 2023-04-07 ENCOUNTER — Encounter: Payer: Self-pay | Admitting: Obstetrics & Gynecology

## 2023-04-07 ENCOUNTER — Other Ambulatory Visit: Payer: Self-pay | Admitting: Obstetrics & Gynecology

## 2023-04-07 DIAGNOSIS — N760 Acute vaginitis: Secondary | ICD-10-CM

## 2023-04-07 LAB — CERVICOVAGINAL ANCILLARY ONLY
Bacterial Vaginitis (gardnerella): POSITIVE — AB
Candida Glabrata: NEGATIVE
Candida Vaginitis: POSITIVE — AB
Chlamydia: NEGATIVE
Comment: NEGATIVE
Comment: NEGATIVE
Comment: NEGATIVE
Comment: NEGATIVE
Comment: NEGATIVE
Comment: NORMAL
Neisseria Gonorrhea: NEGATIVE
Trichomonas: NEGATIVE

## 2023-04-07 MED ORDER — FLUCONAZOLE 150 MG PO TABS: ORAL_TABLET | ORAL | 1 refills | Status: DC

## 2023-04-07 MED ORDER — METRONIDAZOLE 500 MG PO TABS: 500.0000 mg | ORAL_TABLET | Freq: Two times a day (BID) | ORAL | 0 refills | Status: DC

## 2023-04-19 ENCOUNTER — Other Ambulatory Visit: Payer: Self-pay | Admitting: *Deleted

## 2023-04-19 DIAGNOSIS — N760 Acute vaginitis: Secondary | ICD-10-CM

## 2023-04-19 MED ORDER — FLUCONAZOLE 150 MG PO TABS: ORAL_TABLET | ORAL | 1 refills | Status: DC

## 2023-05-01 ENCOUNTER — Emergency Department (HOSPITAL_COMMUNITY)
Admission: EM | Admit: 2023-05-01 | Discharge: 2023-05-01 | Disposition: A | Discharge status: Home / Self Care | Payer: Medicaid Other | Attending: Emergency Medicine | Admitting: Emergency Medicine

## 2023-05-01 ENCOUNTER — Encounter (HOSPITAL_COMMUNITY): Payer: Self-pay

## 2023-05-01 ENCOUNTER — Other Ambulatory Visit: Payer: Self-pay

## 2023-05-01 DIAGNOSIS — R059 Cough, unspecified: Secondary | ICD-10-CM | POA: Diagnosis not present

## 2023-05-01 DIAGNOSIS — J069 Acute upper respiratory infection, unspecified: Secondary | ICD-10-CM | POA: Insufficient documentation

## 2023-05-01 DIAGNOSIS — B9789 Other viral agents as the cause of diseases classified elsewhere: Secondary | ICD-10-CM | POA: Diagnosis not present

## 2023-05-01 LAB — RESP PANEL BY RT-PCR (RSV, FLU A&B, COVID)  RVPGX2
Influenza A by PCR: NEGATIVE
Influenza B by PCR: NEGATIVE
Resp Syncytial Virus by PCR: NEGATIVE
SARS Coronavirus 2 by RT PCR: NEGATIVE

## 2023-05-01 NOTE — ED Triage Notes (Signed)
 Pt here with cough and headache that started this morning, pt says she didn't go to work b/c she didn't feel good and needs work note

## 2023-05-02 DIAGNOSIS — M25562 Pain in left knee: Secondary | ICD-10-CM | POA: Diagnosis not present

## 2023-05-02 NOTE — ED Provider Notes (Signed)
  [place] EMERGENCY DEPARTMENT AT [facility] Provider Note   CSN: [id number] Arrival date & time: 05/01/23  2209     History  Chief Complaint  Patient presents with   Cough    Christina Dixon is a 18 y.o. female.  18 year old female who presents ER today secondary to cough, runny nose and headache.  Started over the last 12 hours or so.  No fevers.  No muscle aches.  No sore throat but does have some right sided ear pain has been going on for about a week.  No other associated symptoms.  Does have a family member who was sick with the flu recently.   Cough      Home Medications Prior to Admission medications   Medication Sig Start Date End Date Taking? Authorizing Provider  fluconazole (DIFLUCAN) 150 MG tablet Take 1 tablet once then repeat in 2 days if needed 04/19/23   Myna Hidalgo, DO  metroNIDAZOLE (FLAGYL) 500 MG tablet Take 1 tablet (500 mg total) by mouth 2 (two) times daily. 04/07/23   Myna Hidalgo, DO      Allergies    Patient has no known allergies.    Review of Systems   Review of Systems  Respiratory:  Positive for cough.     Physical Exam Updated Vital Signs BP (!) 106/56 (BP Location: Right Arm)   Pulse 68   Temp 98.6 F (37 C) (Oral)   Resp 18   Ht 5\' 8"  (1.727 m)   Wt (!) 147 kg   SpO2 100%   BMI 49.28 kg/m  Physical Exam Vitals and nursing note reviewed.  Constitutional:      Appearance: She is well-developed.  HENT:     Head: Normocephalic and atraumatic.     Ears:     Comments: Bilateral external auditory canals with some erythema but no involvement of tympanic membrane or middle ear. Cardiovascular:     Rate and Rhythm: Normal rate and regular rhythm.  Pulmonary:     Effort: No respiratory distress.     Breath sounds: No stridor.  Abdominal:     General: There is no distension.  Musculoskeletal:     Cervical back: Normal range of motion.  Neurological:     Mental Status: She is alert.     ED Results /  Procedures / Treatments   Labs (all labs ordered are listed, but only abnormal results are displayed) Labs Reviewed  RESP PANEL BY RT-PCR (RSV, FLU A&B, COVID)  [id number]    EKG None  Radiology No results found.  Procedures Procedures    Medications Ordered in ED Medications - No data to display  ED Course/ Medical Decision Making/ A&P                                 Medical Decision Making  Some type of viral illness.  Possibly flu however does not have the classic syndrome.  PCR test pending.  No hypoxia or other abnormality to suggest the need for imaging or further workup.  Patient will follow-up with her own results at home.  Would not be a candidate for Tamiflu anyway.  Final Clinical Impression(s) / ED Diagnoses Final diagnoses:  Viral URI with cough    Rx / DC Orders ED Discharge Orders     None         Hartlyn Reigel, Barbara Cower, MD 05/02/23 [phone]

## 2023-05-25 ENCOUNTER — Emergency Department (HOSPITAL_COMMUNITY)
Admission: EM | Admit: 2023-05-25 | Discharge: 2023-05-26 | Disposition: A | Discharge status: Home / Self Care | Attending: Emergency Medicine | Admitting: Emergency Medicine

## 2023-05-25 ENCOUNTER — Encounter (HOSPITAL_COMMUNITY): Payer: Self-pay

## 2023-05-25 ENCOUNTER — Other Ambulatory Visit: Payer: Self-pay

## 2023-05-25 DIAGNOSIS — M25562 Pain in left knee: Secondary | ICD-10-CM | POA: Diagnosis not present

## 2023-05-25 DIAGNOSIS — M2392 Unspecified internal derangement of left knee: Secondary | ICD-10-CM | POA: Diagnosis not present

## 2023-05-25 NOTE — ED Triage Notes (Signed)
 Pt to Ed from home with c/o left "knee keeps locking up" reports this started in january

## 2023-05-26 ENCOUNTER — Emergency Department (HOSPITAL_COMMUNITY)

## 2023-05-26 DIAGNOSIS — M25562 Pain in left knee: Secondary | ICD-10-CM | POA: Diagnosis not present

## 2023-05-26 NOTE — Discharge Instructions (Addendum)
 I suspect that your knee locking is from a torn cartilage in your knee.  Please follow-up with the orthopedic doctor for further evaluation and treatment.  Use the knee immobilizer as needed.  If you are having pain in your knee, you may take ibuprofen or naproxen, and you may also apply ice.

## 2023-05-26 NOTE — ED Provider Notes (Addendum)
 [place] EMERGENCY DEPARTMENT AT [facility] Provider Note   CSN: [id number] Arrival date & time: 05/25/23  2333     History  Chief Complaint  Patient presents with   Knee Problem    "Keeps locking up"    Christina Dixon is a 18 y.o. female.  The history is provided by the patient.  He has history of diet-controlled diabetes and comes in complaining of intermittent episodes of her left knee locking over the last 2 months.  She states it feels like there is an indentation over the lateral aspect of the knee.  It does not occur every day, and when it does not occur she feels that she can move her knee normally and she is normally ambulatory.  She does not recall any specific trauma.   Home Medications Prior to Admission medications   Medication Sig Start Date End Date Taking? Authorizing Provider  fluconazole (DIFLUCAN) 150 MG tablet Take 1 tablet once then repeat in 2 days if needed 04/19/23   Myna Hidalgo, DO  metroNIDAZOLE (FLAGYL) 500 MG tablet Take 1 tablet (500 mg total) by mouth 2 (two) times daily. 04/07/23   Myna Hidalgo, DO      Allergies    Patient has no known allergies.    Review of Systems   Review of Systems  All other systems reviewed and are negative.   Physical Exam Updated Vital Signs BP 115/85   Pulse 74   Temp 99 F (37.2 C)   Resp 17   Ht 5\' 8"  (1.727 m)   Wt (!) 147 kg   SpO2 98%   BMI 49.28 kg/m  Physical Exam Vitals and nursing note reviewed.   18 year old female, resting comfortably and in no acute distress. Vital signs are normal. Oxygen saturation is 98%, which is normal. Head is normocephalic and atraumatic. PERRLA, EOMI.  Lungs are clear without rales, wheezes, or rhonchi. Chest is nontender. Heart has regular rate and rhythm without murmur. Extremities: There is no swelling or deformity of the left knee, no effusion present.  There is no instability on valgus or varus stress.  Lachman and McMurray's tests are  negative. Skin is warm and dry without rash. Neurologic: Mental status is normal, moves all extremities equally.  ED Results / Procedures / Treatments    Radiology DG Knee Complete 4 Views Left Result Date: 05/26/2023 CLINICAL DATA:  Left knee pain, twisting injury EXAM: LEFT KNEE - COMPLETE 4+ VIEW COMPARISON:  None Available. FINDINGS: No evidence of fracture, dislocation, or joint effusion. No evidence of arthropathy or other focal bone abnormality. Soft tissues are unremarkable. IMPRESSION: Negative. Electronically Signed   By: Charlett Nose M.D.   On: 05/26/2023 00:24    Procedures Procedures    Medications Ordered in ED Medications - No data to display  ED Course/ Medical Decision Making/ A&P                                 Medical Decision Making Amount and/or Complexity of Data Reviewed Radiology: ordered.   Intermittent locking of the left knee suspicious for meniscus injury.  Knee x-ray shows no evidence of acute injury.  I have independently viewed the images, and agree with the radiologist's interpretation.  I have ordered a knee immobilizer to use as needed and I am referring her to orthopedics for follow-up.  Final Clinical Impression(s) / ED Diagnoses Final diagnoses:  Knee  locking, left    Rx / DC Orders ED Discharge Orders     None         Dione Booze, MD 05/26/23 0142    Dione Booze, MD 05/26/23 0200

## 2023-05-26 NOTE — ED Notes (Signed)
Patient able to ambulate without difficulty.

## 2023-05-26 NOTE — ED Notes (Signed)
 ED Provider at bedside.

## 2023-06-07 ENCOUNTER — Ambulatory Visit
Admission: EM | Admit: 2023-06-07 | Discharge: 2023-06-07 | Disposition: A | Discharge status: Home / Self Care | Attending: Family Medicine | Admitting: Family Medicine

## 2023-06-07 DIAGNOSIS — M25562 Pain in left knee: Secondary | ICD-10-CM

## 2023-06-07 DIAGNOSIS — E88819 Insulin resistance, unspecified: Secondary | ICD-10-CM

## 2023-06-07 DIAGNOSIS — G8929 Other chronic pain: Secondary | ICD-10-CM | POA: Diagnosis not present

## 2023-06-07 LAB — POCT FASTING CBG KUC MANUAL ENTRY: POCT Glucose (KUC): 110 mg/dL — AB (ref 70–99)

## 2023-06-07 MED ORDER — METFORMIN HCL ER 500 MG PO TB24: 500.0000 mg | ORAL_TABLET | Freq: Every day | ORAL | 0 refills | Status: DC

## 2023-06-07 MED ORDER — IBUPROFEN 600 MG PO TABS: 600.0000 mg | ORAL_TABLET | Freq: Three times a day (TID) | ORAL | 0 refills | Status: DC | PRN

## 2023-06-07 NOTE — ED Provider Notes (Signed)
 [facility]    CSN: [id number] Arrival date & time: 06/07/23  1216      History   Chief Complaint No chief complaint on file.   HPI Christina Dixon is a 18 y.o. female.   Patient presenting today with multiple concerns.  She is having ongoing left knee pain, locking up that has been ongoing for several months now.  Was seen in the emergency department last week for this issue, x-ray of the left knee was negative for bony injury and she was referred to orthopedics.  States she has an appointment with them on Thursday but she did not want to wait that long as the pain can be significant.  Denies redness, swelling, numbness, tingling, loss of range of motion, known injury to the area.  Trying ice and heat with minimal relief.  States she has some braces at home but does not wear them often.  She also stopped her metformin a while back as it was causing diarrhea, wants her blood sugar to be checked.  States she has been fatigued lately and is concerned about her blood sugar.  Metformin is managed by her OB/GYN for insulin resistance.    Past Medical History:  Diagnosis Date   Cough 05/28/2014   Diabetes mellitus without complication (HCC)    diet controlled   Difficulty swallowing pills    Obesity    Stuffy nose 05/28/2014   Tonsillar and adenoid hypertrophy 05/2014   snores during sleep, occasionally stops breathing, per mother    Patient Active Problem List   Diagnosis Date Noted   Irregular menstrual cycle 11/05/2019   S/P laparoscopic appendectomy 08/08/2017   Acute appendicitis    Prediabetes 01/24/2015   Adjustment disorder with depressed mood 11/21/2014   Severe obesity due to excess calories without serious comorbidity with body mass index (BMI) greater than 99th percentile for age in pediatric patient (HCC) 10/17/2014   Acanthosis nigricans 10/17/2014   Dyspepsia 10/17/2014   Insulin resistance 10/17/2014   Mixed hyperlipidemia 10/17/2014   Hyperinsulinemia  10/04/2014    Past Surgical History:  Procedure Laterality Date   LAPAROSCOPIC APPENDECTOMY N/A 08/08/2017   Procedure: APPENDECTOMY LAPAROSCOPIC;  Surgeon: Franky Macho, MD;  Location: [facility];  Service: General;  Laterality: N/A;   TONSILLECTOMY AND ADENOIDECTOMY Bilateral 06/04/2014   Procedure: BILATERAL TONSILLECTOMY AND ADENOIDECTOMY;  Surgeon: Newman Pies, MD;  Location: [facility];  Service: ENT;  Laterality: Bilateral;    OB History     Gravida  0   Para  0   Term  0   Preterm  0   AB  0   Living  0      SAB  0   IAB  0   Ectopic  0   Multiple  0   Live Births  0            Home Medications    Prior to Admission medications   Medication Sig Start Date End Date Taking? Authorizing Provider  ibuprofen (ADVIL) 600 MG tablet Take 1 tablet (600 mg total) by mouth every 8 (eight) hours as needed. 06/07/23  Yes Particia Nearing, PA-C  metFORMIN (GLUCOPHAGE-XR) 500 MG 24 hr tablet Take 1 tablet (500 mg total) by mouth daily with breakfast. 06/07/23  Yes Particia Nearing, PA-C  fluconazole (DIFLUCAN) 150 MG tablet Take 1 tablet once then repeat in 2 days if needed 04/19/23   Myna Hidalgo, DO  metroNIDAZOLE (FLAGYL) 500 MG tablet Take 1  tablet (500 mg total) by mouth 2 (two) times daily. 04/07/23   Myna Hidalgo, DO    Family History Family History  Problem Relation Age of Onset   Diabetes Father    Hypertension Father    Asthma Father    Seizures Father    Hypertension Maternal Grandmother    Hypertension Maternal Grandfather    Diabetes Paternal Grandfather    Hypertension Paternal Grandfather    Depression Mother    Anxiety disorder Mother    Hypertension Paternal Grandmother    Asthma Brother    Migraines Brother    Migraines Maternal Aunt    Bipolar disorder Neg Hx    Schizophrenia Neg Hx    ADD / ADHD Neg Hx    Autism Neg Hx     Social History Social History   Tobacco Use   Smoking status: Never    Passive  exposure: Yes   Smokeless tobacco: Never   Tobacco comments:    mother smokes outside most of the time  Vaping Use   Vaping status: Never Used  Substance Use Topics   Alcohol use: No   Drug use: No     Allergies   Patient has no known allergies.   Review of Systems Review of Systems PER HPI  Physical Exam Triage Vital Signs ED Triage Vitals  Encounter Vitals Group     BP 06/07/23 1232 114/78     Systolic BP Percentile --      Diastolic BP Percentile --      Pulse Rate 06/07/23 1232 80     Resp 06/07/23 1232 16     Temp 06/07/23 1232 98.6 F (37 C)     Temp Source 06/07/23 1232 Oral     SpO2 06/07/23 1232 98 %     Weight --      Height --      Head Circumference --      Peak Flow --      Pain Score 06/07/23 1233 6     Pain Loc --      Pain Education --      Exclude from Growth Chart --    No data found.  Updated Vital Signs BP 114/78 (BP Location: Right Arm)   Pulse 80   Temp 98.6 F (37 C) (Oral)   Resp 16   LMP 06/02/2023 (Approximate)   SpO2 98%   Visual Acuity Right Eye Distance:   Left Eye Distance:   Bilateral Distance:    Right Eye Near:   Left Eye Near:    Bilateral Near:     Physical Exam Vitals and nursing note reviewed.  Constitutional:      Appearance: Normal appearance. She is not ill-appearing.  HENT:     Head: Atraumatic.  Eyes:     Extraocular Movements: Extraocular movements intact.     Conjunctiva/sclera: Conjunctivae normal.  Cardiovascular:     Rate and Rhythm: Normal rate.  Pulmonary:     Effort: Pulmonary effort is normal.  Musculoskeletal:        General: Tenderness present. No swelling, deformity or signs of injury. Normal range of motion.     Cervical back: Normal range of motion and neck supple.     Comments: Tenderness to palpation to the left lateral knee, no joint instability noted  Skin:    General: Skin is warm and dry.     Findings: No bruising or erythema.  Neurological:     Mental Status: She is alert  and oriented to person, place, and time.     Motor: No weakness.     Gait: Gait normal.     Comments: Left lower extremity neurovascular intact  Psychiatric:        Mood and Affect: Mood normal.        Thought Content: Thought content normal.        Judgment: Judgment normal.      UC Treatments / Results  Labs (all labs ordered are listed, but only abnormal results are displayed) Labs Reviewed  POCT FASTING CBG [facility] MANUAL ENTRY - Abnormal; Notable for the following components:      Result Value   POCT Glucose ([facility]) 110 (*)    All other components within normal limits    EKG   Radiology No results found.  Procedures Procedures (including critical care time)  Medications Ordered in UC Medications - No data to display  Initial Impression / Assessment and Plan / UC Course  I have reviewed the triage vital signs and the nursing notes.  Pertinent labs & imaging results that were available during my care of the patient were reviewed by me and considered in my medical decision making (see chart for details).     Vitals and exam very reassuring today.  X-ray reviewed from the emergency department visit last week and negative for bony abnormality.  Awaiting MRI and Ortho follow-up on Thursday.  Ibuprofen, RICE protocol in the meantime.  Point-of-care glucose today is 110 random.  Given her side effects with her previous metformin we will switch to metformin XR to see if she tolerates this better and follow-up with OB/GYN for recheck.  Lifestyle modifications reviewed.  Final Clinical Impressions(s) / UC Diagnoses   Final diagnoses:  Chronic pain of left knee  Insulin resistance   Discharge Instructions   None    ED Prescriptions     Medication Sig Dispense Auth. Provider   metFORMIN (GLUCOPHAGE-XR) 500 MG 24 hr tablet Take 1 tablet (500 mg total) by mouth daily with breakfast. 30 tablet Particia Nearing, PA-C   ibuprofen (ADVIL) 600 MG tablet Take 1 tablet (600 mg  total) by mouth every 8 (eight) hours as needed. 30 tablet Particia Nearing, [place]      PDMP not reviewed this encounter.   Particia Nearing, [place] 06/07/23 1431

## 2023-06-07 NOTE — ED Triage Notes (Signed)
 Pt reports she is having left knee pain and "cramping" that has been happening on and off x 3 weeks or more.   Pt states she has been feeling more sleepy and her face is getting "to dark" so she wants her CBG checked. States she has "just stopped" taking her metformin

## 2023-06-09 ENCOUNTER — Ambulatory Visit: Admitting: Orthopedic Surgery

## 2023-07-19 ENCOUNTER — Other Ambulatory Visit: Payer: Self-pay | Admitting: Family Medicine

## 2023-07-20 ENCOUNTER — Emergency Department (HOSPITAL_COMMUNITY)

## 2023-07-20 ENCOUNTER — Encounter (HOSPITAL_COMMUNITY): Payer: Self-pay

## 2023-07-20 ENCOUNTER — Emergency Department (HOSPITAL_COMMUNITY)
Admission: EM | Admit: 2023-07-20 | Discharge: 2023-07-21 | Disposition: A | Discharge status: Home / Self Care | Attending: Emergency Medicine | Admitting: Emergency Medicine

## 2023-07-20 ENCOUNTER — Other Ambulatory Visit: Payer: Self-pay

## 2023-07-20 DIAGNOSIS — E119 Type 2 diabetes mellitus without complications: Secondary | ICD-10-CM | POA: Diagnosis not present

## 2023-07-20 DIAGNOSIS — E669 Obesity, unspecified: Secondary | ICD-10-CM | POA: Insufficient documentation

## 2023-07-20 DIAGNOSIS — R197 Diarrhea, unspecified: Secondary | ICD-10-CM | POA: Diagnosis not present

## 2023-07-20 DIAGNOSIS — E860 Dehydration: Secondary | ICD-10-CM | POA: Diagnosis not present

## 2023-07-20 DIAGNOSIS — R059 Cough, unspecified: Secondary | ICD-10-CM | POA: Diagnosis not present

## 2023-07-20 DIAGNOSIS — Z7984 Long term (current) use of oral hypoglycemic drugs: Secondary | ICD-10-CM | POA: Diagnosis not present

## 2023-07-20 DIAGNOSIS — R109 Unspecified abdominal pain: Secondary | ICD-10-CM | POA: Diagnosis present

## 2023-07-20 LAB — CBC WITH DIFFERENTIAL/PLATELET
Abs Immature Granulocytes: 0.03 10*3/uL (ref 0.00–0.07)
Basophils Absolute: 0.1 10*3/uL (ref 0.0–0.1)
Basophils Relative: 1 %
Eosinophils Absolute: 0.1 10*3/uL (ref 0.0–0.5)
Eosinophils Relative: 1 %
HCT: 43.5 % (ref 36.0–46.0)
Hemoglobin: 14.6 g/dL (ref 12.0–15.0)
Immature Granulocytes: 0 %
Lymphocytes Relative: 44 %
Lymphs Abs: 3.5 10*3/uL (ref 0.7–4.0)
MCH: 28.9 pg (ref 26.0–34.0)
MCHC: 33.6 g/dL (ref 30.0–36.0)
MCV: 86 fL (ref 80.0–100.0)
Monocytes Absolute: 0.7 10*3/uL (ref 0.1–1.0)
Monocytes Relative: 9 %
Neutro Abs: 3.5 10*3/uL (ref 1.7–7.7)
Neutrophils Relative %: 45 %
Platelets: 241 10*3/uL (ref 150–400)
RBC: 5.06 MIL/uL (ref 3.87–5.11)
RDW: 13.3 % (ref 11.5–15.5)
WBC: 7.8 10*3/uL (ref 4.0–10.5)
nRBC: 0 % (ref 0.0–0.2)

## 2023-07-20 LAB — RESP PANEL BY RT-PCR (RSV, FLU A&B, COVID)  RVPGX2
Influenza A by PCR: NEGATIVE
Influenza B by PCR: NEGATIVE
Resp Syncytial Virus by PCR: NEGATIVE
SARS Coronavirus 2 by RT PCR: NEGATIVE

## 2023-07-20 LAB — I-STAT CHEM 8, ED
BUN: 17 mg/dL (ref 6–20)
Calcium, Ion: 1.18 mmol/L (ref 1.15–1.40)
Chloride: 106 mmol/L (ref 98–111)
Creatinine, Ser: 1 mg/dL (ref 0.44–1.00)
Glucose, Bld: 81 mg/dL (ref 70–99)
HCT: 43 % (ref 36.0–46.0)
Hemoglobin: 14.6 g/dL (ref 12.0–15.0)
Potassium: 3.9 mmol/L (ref 3.5–5.1)
Sodium: 140 mmol/L (ref 135–145)
TCO2: 21 mmol/L — ABNORMAL LOW (ref 22–32)

## 2023-07-20 LAB — HCG, SERUM, QUALITATIVE: Preg, Serum: NEGATIVE

## 2023-07-20 MED ORDER — FAMOTIDINE IN NACL 20-0.9 MG/50ML-% IV SOLN
20.0000 mg | Freq: Once | INTRAVENOUS | Status: AC
  Administered 2023-07-20: 20 mg via INTRAVENOUS
  Filled 2023-07-20: qty 50

## 2023-07-20 MED ORDER — LOPERAMIDE HCL 2 MG PO CAPS
2.0000 mg | ORAL_CAPSULE | Freq: Once | ORAL | Status: AC
  Administered 2023-07-21: 2 mg via ORAL
  Filled 2023-07-20: qty 1

## 2023-07-20 MED ORDER — SODIUM CHLORIDE 0.9 % IV BOLUS
1000.0000 mL | Freq: Once | INTRAVENOUS | Status: AC
  Administered 2023-07-20: 1000 mL via INTRAVENOUS

## 2023-07-20 NOTE — ED Triage Notes (Signed)
 Pt reports diarrhea since this morning.  Pt denies any nausea or vomiting.

## 2023-07-20 NOTE — Discharge Instructions (Addendum)
OTC imodium as needed for diarrhea. °

## 2023-07-20 NOTE — ED Notes (Signed)
Pt aware UA is needed.

## 2023-07-20 NOTE — ED Provider Notes (Addendum)
 [place] EMERGENCY DEPARTMENT AT [facility] Provider Note   CSN: [id number] Arrival date & time: 07/20/23  2013     History  Chief Complaint  Patient presents with   Diarrhea    Christina Dixon is a 18 y.o. female.  Pt is a 18 yo female with pmhx significant for dm and obesity.  She presents to the ED today with diarrhea.  She has some abd pain as well.  No fever.  She has had a cough.       Home Medications Prior to Admission medications   Medication Sig Start Date End Date Taking? Authorizing Provider  fluconazole  (DIFLUCAN ) 150 MG tablet Take 1 tablet once then repeat in 2 days if needed 04/19/23   Ozan, Jennifer, DO  ibuprofen  (ADVIL ) 600 MG tablet Take 1 tablet (600 mg total) by mouth every 8 (eight) hours as needed. 06/07/23   Corbin Dess, PA-C  metFORMIN  (GLUCOPHAGE -XR) 500 MG 24 hr tablet Take 1 tablet (500 mg total) by mouth daily with breakfast. 06/07/23   Corbin Dess, PA-C  metroNIDAZOLE  (FLAGYL ) 500 MG tablet Take 1 tablet (500 mg total) by mouth 2 (two) times daily. 04/07/23   Ozan, Jennifer, DO      Allergies    Patient has no known allergies.    Review of Systems   Review of Systems  Respiratory:  Positive for cough.   Gastrointestinal:  Positive for abdominal pain and diarrhea.  All other systems reviewed and are negative.   Physical Exam Updated Vital Signs BP 105/69 (BP Location: Right Arm)   Pulse 73   Temp 98.9 F (37.2 C) (Oral)   Resp 16   Ht 5\' 8"  (1.727 m)   Wt (!) 147 kg   LMP 06/27/2023 (Exact Date)   SpO2 100%   BMI 49.28 kg/m  Physical Exam Vitals and nursing note reviewed.  Constitutional:      Appearance: Normal appearance.  HENT:     Head: Normocephalic and atraumatic.     Right Ear: External ear normal.     Left Ear: External ear normal.     Nose: Nose normal.     Mouth/Throat:     Mouth: Mucous membranes are moist.     Pharynx: Oropharynx is clear.  Eyes:     Extraocular Movements:  Extraocular movements intact.     Conjunctiva/sclera: Conjunctivae normal.     Pupils: Pupils are equal, round, and reactive to light.  Cardiovascular:     Rate and Rhythm: Normal rate and regular rhythm.     Pulses: Normal pulses.     Heart sounds: Normal heart sounds.  Pulmonary:     Effort: Pulmonary effort is normal.     Breath sounds: Normal breath sounds.  Abdominal:     General: Abdomen is flat. Bowel sounds are normal.     Palpations: Abdomen is soft.  Musculoskeletal:        General: Normal range of motion.     Cervical back: Normal range of motion and neck supple.  Skin:    General: Skin is warm.     Capillary Refill: Capillary refill takes less than 2 seconds.  Neurological:     General: No focal deficit present.     Mental Status: She is alert and oriented to person, place, and time.  Psychiatric:        Mood and Affect: Mood normal.        Behavior: Behavior normal.     ED  Results / Procedures / Treatments   Labs (all labs ordered are listed, but only abnormal results are displayed) Labs Reviewed  RESP PANEL BY RT-PCR (RSV, FLU A&B, COVID)  [id number]  CBC WITH DIFFERENTIAL/PLATELET  HCG, SERUM, QUALITATIVE  COMPREHENSIVE METABOLIC PANEL WITH GFR  LIPASE, BLOOD  URINALYSIS, ROUTINE W REFLEX MICROSCOPIC  I-STAT CHEM 8, ED    EKG None  Radiology DG Chest 2 View Result Date: 07/20/2023 CLINICAL DATA:  Cough, diarrhea EXAM: CHEST - 2 VIEW COMPARISON:  11/19/2019 FINDINGS: Frontal and lateral views of the chest demonstrate an unremarkable cardiac silhouette. No acute airspace disease, effusion, or pneumothorax. No acute bony abnormalities. IMPRESSION: 1. No acute intrathoracic process. Electronically Signed   By: Bobbye Dixon M.D.   On: 07/20/2023 21:34    Procedures Procedures    Medications Ordered in ED Medications  sodium chloride  0.9 % bolus 1,000 mL (1,000 mLs Intravenous New Bag/Given 07/20/23 2139)  famotidine (PEPCID) IVPB 20 mg premix (20 mg  Intravenous New Bag/Given 07/20/23 2138)    ED Course/ Medical Decision Making/ A&P                                 Medical Decision Making Amount and/or Complexity of Data Reviewed Labs: ordered. Radiology: ordered.  Risk Prescription drug management.   This patient presents to the ED for concern of diarrhea, this involves an extensive number of treatment options, and is a complaint that carries with it a high risk of complications and morbidity.  The differential diagnosis includes electrolyte abn, infection   Co morbidities that complicate the patient evaluation  dm and obesity   Additional history obtained:  Additional history obtained from epic chart review  Lab Tests:  I Ordered, and personally interpreted labs.  The pertinent results include:  cbc nl, covid/flu/rsv neg, istat chem 8 neg,    Imaging Studies ordered:  I ordered imaging studies including cxr  I independently visualized and interpreted imaging which showed 1. No acute intrathoracic process.  I agree with the radiologist interpretation   Medicines ordered and prescription drug management:  I ordered medication including ivfs/pepcid  for sx  Reevaluation of the patient after these medicines showed that the patient improved I have reviewed the patients home medicines and have made adjustments as needed  Problem List / ED Course:  Diarrhea:  pt given ivfs and has improved. Pt has not had any diarrhea while here.  She is stable for d/c.  Return if worse.   Reevaluation:  After the interventions noted above, I reevaluated the patient and found that they have :improved   Social Determinants of Health:  Lives at home   Dispostion:  Discharge with outpatient f/u        Final Clinical Impression(s) / ED Diagnoses Final diagnoses:  Diarrhea, unspecified type  Dehydration    Rx / DC Orders ED Discharge Orders     None         Sueellen Emery, MD 07/20/23 1610    Sueellen Emery, MD 07/20/23 2346

## 2023-07-21 LAB — COMPREHENSIVE METABOLIC PANEL WITH GFR
ALT: 21 U/L (ref 0–44)
AST: 21 U/L (ref 15–41)
Albumin: 3.8 g/dL (ref 3.5–5.0)
Alkaline Phosphatase: 60 U/L (ref 38–126)
Anion gap: 11 (ref 5–15)
BUN: 13 mg/dL (ref 6–20)
CO2: 21 mmol/L — ABNORMAL LOW (ref 22–32)
Calcium: 9.1 mg/dL (ref 8.9–10.3)
Chloride: 106 mmol/L (ref 98–111)
Creatinine, Ser: 1.08 mg/dL — ABNORMAL HIGH (ref 0.44–1.00)
GFR, Estimated: >60 mL/min (ref >60)
Glucose, Bld: 92 mg/dL (ref 70–99)
Potassium: 4.1 mmol/L (ref 3.5–5.1)
Sodium: 138 mmol/L (ref 135–145)
Total Bilirubin: <0.2 mg/dL (ref 0.0–1.2)
Total Protein: 6.6 g/dL (ref 6.5–8.1)

## 2023-07-21 LAB — LIPASE, BLOOD: Lipase: 35 U/L (ref 11–51)

## 2023-07-29 ENCOUNTER — Ambulatory Visit (INDEPENDENT_AMBULATORY_CARE_PROVIDER_SITE_OTHER)

## 2023-07-29 ENCOUNTER — Ambulatory Visit
Admission: EM | Admit: 2023-07-29 | Discharge: 2023-07-29 | Disposition: A | Discharge status: Home / Self Care | Attending: Nurse Practitioner | Admitting: Nurse Practitioner

## 2023-07-29 ENCOUNTER — Telehealth: Payer: Self-pay | Admitting: Nurse Practitioner

## 2023-07-29 DIAGNOSIS — R197 Diarrhea, unspecified: Secondary | ICD-10-CM | POA: Diagnosis not present

## 2023-07-29 DIAGNOSIS — R109 Unspecified abdominal pain: Secondary | ICD-10-CM

## 2023-07-29 DIAGNOSIS — R140 Abdominal distension (gaseous): Secondary | ICD-10-CM | POA: Diagnosis not present

## 2023-07-29 LAB — POCT URINALYSIS DIP (MANUAL ENTRY)
Bilirubin, UA: NEGATIVE
Blood, UA: NEGATIVE
Glucose, UA: NEGATIVE mg/dL
Ketones, POC UA: NEGATIVE mg/dL
Leukocytes, UA: NEGATIVE
Nitrite, UA: NEGATIVE
Protein Ur, POC: NEGATIVE mg/dL
Spec Grav, UA: >=1.03 — AB
Urobilinogen, UA: 0.2 U/dL
pH, UA: 6.5

## 2023-07-29 LAB — POCT URINE PREGNANCY: Preg Test, Ur: NEGATIVE

## 2023-07-29 MED ORDER — LIDOCAINE VISCOUS HCL 2 % MT SOLN
15.0000 mL | Freq: Once | OROMUCOSAL | Status: AC
  Administered 2023-07-29: 15 mL via OROMUCOSAL

## 2023-07-29 MED ORDER — LOPERAMIDE HCL 2 MG PO CAPS: 2.0000 mg | ORAL_CAPSULE | Freq: Four times a day (QID) | ORAL | 0 refills | Status: DC | PRN

## 2023-07-29 MED ORDER — MYLANTA MAXIMUM STRENGTH 400-400-40 MG/5ML PO SUSP: 15.0000 mL | Freq: Four times a day (QID) | ORAL | 0 refills | Status: DC | PRN

## 2023-07-29 MED ORDER — FAMOTIDINE 20 MG PO TABS: 20.0000 mg | ORAL_TABLET | Freq: Every day | ORAL | 0 refills | Status: DC

## 2023-07-29 MED ORDER — ALUM & MAG HYDROXIDE-SIMETH 200-200-20 MG/5ML PO SUSP
30.0000 mL | Freq: Once | ORAL | Status: AC
  Administered 2023-07-29: 30 mL via ORAL

## 2023-07-29 MED ORDER — KETOROLAC TROMETHAMINE 30 MG/ML IJ SOLN
30.0000 mg | Freq: Once | INTRAMUSCULAR | Status: AC
  Administered 2023-07-29: 30 mg via INTRAMUSCULAR

## 2023-07-29 NOTE — Telephone Encounter (Signed)
 Received abdominal x-ray results.  Call patient to discuss result, and check the patient's condition.  Reached voicemail, left message for patient to return the phone call.

## 2023-07-29 NOTE — ED Triage Notes (Signed)
 Pt states abdominal cramping and diarrhea for the past 10 days.  Seen at the ED on 07/20/2023 for the same.

## 2023-07-29 NOTE — ED Provider Notes (Signed)
 [facility]    CSN: [id number] Arrival date & time: 07/29/23  1106      History   Chief Complaint No chief complaint on file.   HPI Christina Dixon is a 18 y.o. female.   The history is provided by the patient.   Patient presents for ongoing abdominal pain/cramping and diarrhea.  Patient was seen in the emergency department for the same or similar symptoms on 07/20/2023.  Patient states that her abdominal pain has since improved, but has not gone away.  She rates her abdominal pain 8/10 at present.  She states that she is having approximately 4 episodes of diarrhea per day.  She denies fever, chills, vomiting, bloody stools, or melena stools.  She does endorse gas and bloating.  Patient states that she has been taking over-the-counter Tylenol  with minimal relief of her symptoms.  States that she thought symptoms were initially caused by her period.  LMP 07/26/2023. Past Medical History:  Diagnosis Date   Cough 05/28/2014   Diabetes mellitus without complication (HCC)    diet controlled   Difficulty swallowing pills    Obesity    Stuffy nose 05/28/2014   Tonsillar and adenoid hypertrophy 05/2014   snores during sleep, occasionally stops breathing, per mother    Patient Active Problem List   Diagnosis Date Noted   Irregular menstrual cycle 11/05/2019   S/P laparoscopic appendectomy 08/08/2017   Acute appendicitis    Prediabetes 01/24/2015   Adjustment disorder with depressed mood 11/21/2014   Severe obesity due to excess calories without serious comorbidity with body mass index (BMI) greater than 99th percentile for age in pediatric patient (HCC) 10/17/2014   Acanthosis nigricans 10/17/2014   Dyspepsia 10/17/2014   Insulin resistance 10/17/2014   Mixed hyperlipidemia 10/17/2014   Hyperinsulinemia 10/04/2014   Lightheadedness 10/04/2014   Other chest pain 10/04/2014    Past Surgical History:  Procedure Laterality Date   LAPAROSCOPIC APPENDECTOMY N/A 08/08/2017    Procedure: APPENDECTOMY LAPAROSCOPIC;  Surgeon: Alanda Allegra, MD;  Location: [facility];  Service: General;  Laterality: N/A;   TONSILLECTOMY AND ADENOIDECTOMY Bilateral 06/04/2014   Procedure: BILATERAL TONSILLECTOMY AND ADENOIDECTOMY;  Surgeon: Reynold Caves, MD;  Location: [facility];  Service: ENT;  Laterality: Bilateral;    OB History     Gravida  0   Para  0   Term  0   Preterm  0   AB  0   Living  0      SAB  0   IAB  0   Ectopic  0   Multiple  0   Live Births  0            Home Medications    Prior to Admission medications   Medication Sig Start Date End Date Taking? Authorizing Provider  fluconazole  (DIFLUCAN ) 150 MG tablet Take 1 tablet once then repeat in 2 days if needed 04/19/23   Ozan, Jennifer, DO  ibuprofen  (ADVIL ) 600 MG tablet Take 1 tablet (600 mg total) by mouth every 8 (eight) hours as needed. 06/07/23   Corbin Dess, PA-C  metFORMIN  (GLUCOPHAGE -XR) 500 MG 24 hr tablet Take 1 tablet (500 mg total) by mouth daily with breakfast. 06/07/23   Corbin Dess, PA-C  metroNIDAZOLE  (FLAGYL ) 500 MG tablet Take 1 tablet (500 mg total) by mouth 2 (two) times daily. 04/07/23   Ozan, Jennifer, DO    Family History Family History  Problem Relation Age of Onset   Diabetes Father  Hypertension Father    Asthma Father    Seizures Father    Hypertension Maternal Grandmother    Hypertension Maternal Grandfather    Diabetes Paternal Grandfather    Hypertension Paternal Grandfather    Depression Mother    Anxiety disorder Mother    Hypertension Paternal Grandmother    Asthma Brother    Migraines Brother    Migraines Maternal Aunt    Bipolar disorder Neg Hx    Schizophrenia Neg Hx    ADD / ADHD Neg Hx    Autism Neg Hx     Social History Social History   Tobacco Use   Smoking status: Never    Passive exposure: Yes   Smokeless tobacco: Never   Tobacco comments:    mother smokes outside most of the time  Vaping Use    Vaping status: Never Used  Substance Use Topics   Alcohol use: No   Drug use: No     Allergies   Patient has no known allergies.   Review of Systems Review of Systems Per HPI  Physical Exam Triage Vital Signs ED Triage Vitals  Encounter Vitals Group     BP 07/29/23 1135 120/80     Systolic BP Percentile --      Diastolic BP Percentile --      Pulse Rate 07/29/23 1135 71     Resp 07/29/23 1135 16     Temp 07/29/23 1135 98.4 F (36.9 C)     Temp Source 07/29/23 1135 Oral     SpO2 07/29/23 1135 98 %     Weight --      Height --      Head Circumference --      Peak Flow --      Pain Score 07/29/23 1137 8     Pain Loc --      Pain Education --      Exclude from Growth Chart --    No data found.  Updated Vital Signs BP 120/80 (BP Location: Right Arm)   Pulse 71   Temp 98.4 F (36.9 C) (Oral)   Resp 16   LMP 07/26/2023 (Exact Date)   SpO2 98%   Visual Acuity Right Eye Distance:   Left Eye Distance:   Bilateral Distance:    Right Eye Near:   Left Eye Near:    Bilateral Near:     Physical Exam Vitals and nursing note reviewed.  Constitutional:      General: She is not in acute distress.    Appearance: Normal appearance.  HENT:     Head: Normocephalic.  Eyes:     Extraocular Movements: Extraocular movements intact.     Conjunctiva/sclera: Conjunctivae normal.     Pupils: Pupils are equal, round, and reactive to light.  Cardiovascular:     Rate and Rhythm: Normal rate and regular rhythm.     Pulses: Normal pulses.     Heart sounds: Normal heart sounds.  Pulmonary:     Effort: Pulmonary effort is normal. No respiratory distress.     Breath sounds: Normal breath sounds. No stridor. No wheezing, rhonchi or rales.  Abdominal:     General: Bowel sounds are normal. There is no distension.     Palpations: Abdomen is soft.     Tenderness: There is abdominal tenderness in the periumbilical area. There is no guarding.  Musculoskeletal:     Cervical back:  Normal range of motion.  Skin:    General: Skin is warm and  dry.  Neurological:     General: No focal deficit present.     Mental Status: She is alert and oriented to person, place, and time.  Psychiatric:        Mood and Affect: Mood normal.        Behavior: Behavior normal.      UC Treatments / Results  Labs (all labs ordered are listed, but only abnormal results are displayed) Labs Reviewed  POCT URINALYSIS DIP (MANUAL ENTRY) - Abnormal; Notable for the following components:      Result Value   Clarity, UA cloudy (*)    Spec Grav, UA >=1.030 (*)    All other components within normal limits    EKG   Radiology No results found.  Procedures Procedures (including critical care time)  Medications Ordered in UC Medications - No data to display  Initial Impression / Assessment and Plan / UC Course  I have reviewed the triage vital signs and the nursing notes.  Pertinent labs & imaging results that were available during my care of the patient were reviewed by me and considered in my medical decision making (see chart for details).  Xray of the abdomen is pending.  GI cocktail was administered, with good improvement of symptoms, rates abdominal pain 6/10 is present.  Toradol  30 mg IM also administered.  Imodium  A-D 2 mg prescribed for diarrhea, Mylanta maximum strength and famotidine  20 mg for GI upset.  Supportive care recommendations were provided and discussed with the patient to include over-the-counter analgesics, fluids, rest, and to monitor for worsening symptoms.  Patient was given strict ER follow-up precautions.  Patient was also given information for PCP in the area who is excepting patients.  Patient was in agreement with this plan of care and verbalizes understanding.  All questions were answered.  Patient stable for discharge.   Final Clinical Impressions(s) / UC Diagnoses   Final diagnoses:  None   Discharge Instructions   None    ED Prescriptions    None    PDMP not reviewed this encounter.   Hardy Lia, NP 07/29/23 1255

## 2023-07-29 NOTE — Discharge Instructions (Addendum)
 The x-ray of your abdomen is pending.  You will be contacted when the results of the x-ray are received.  You also have access to the results via MyChart. You were given a GI cocktail and an injection of Toradol  30 mg.  Do not take any additional NSAIDs today to include ibuprofen , Aleve , Advil , Motrin , or naproxen .  You may take Tylenol  for breakthrough pain or discomfort. Increase fluids allow for plenty of rest. Recommend a brat diet to include bananas, rice, applesauce, or toast.  You may also try a diet to help decrease some of the diarrhea in your stool.  I have provided information which you can refer to. Go to the emergency department immediately if you experience worsening abdominal pain, diarrhea, nausea, vomiting, fever, or chills. I have provided information for you for a PCP in the area who is excepting patients.  Please call to schedule an appointment. Follow-up as needed.

## 2023-07-30 ENCOUNTER — Telehealth: Payer: Self-pay | Admitting: Nurse Practitioner

## 2023-07-30 NOTE — Telephone Encounter (Signed)
 Call patient to discuss x-ray results.  Spoke with patient, verified with 2 patient identifiers.  Advised patient that abdominal x-ray shows bowel gas, and that there may be concern for possible obstruction.  Patient reports that she "still feels the same, states that she is not feeling any better."  Patient was advised that if symptoms have not changed, it is recommended that she go to the emergency department for further evaluation.  Patient was in agreement with this plan of care and verbalized understanding.  All questions were answered.

## 2023-08-02 ENCOUNTER — Ambulatory Visit (HOSPITAL_COMMUNITY): Payer: Self-pay

## 2023-09-16 ENCOUNTER — Telehealth: Payer: Self-pay

## 2023-09-16 NOTE — Progress Notes (Unsigned)
 Complex Care Management Note Care Guide Note  09/16/2023 Name: Christina Dixon MRN: [id number] DOB: [birth year]   Complex Care Management Outreach Attempts: An unsuccessful telephone outreach was attempted today to offer the patient information about available complex care management services.  Follow Up Plan:  Additional outreach attempts will be made to offer the patient complex care management information and services.   Encounter Outcome:  No Answer  Sig Jeoffrey Buffalo , RMA     [facility]  [facility], [facility] Guide  Direct Dial: [phone]  Website: [email or web address]

## 2023-09-20 ENCOUNTER — Telehealth: Payer: Self-pay

## 2023-09-20 DIAGNOSIS — E782 Mixed hyperlipidemia: Secondary | ICD-10-CM

## 2023-09-20 DIAGNOSIS — R7303 Prediabetes: Secondary | ICD-10-CM

## 2023-09-20 NOTE — Progress Notes (Signed)
 Complex Care Management Note  Care Guide Note 09/20/2023 Name: IRENA GAYDOS MRN: [id number] DOB: 10-10-2005  Marrian ONEIDA Pouch is a 18 y.o. year old female who sees Patient, No Pcp Per for primary care. I reached out to Marrian ONEIDA Pouch by phone today to offer complex care management services.  Ms. Kalis was given information about Complex Care Management services today including:   The Complex Care Management services include support from the care team which includes your Nurse Care Manager, Clinical Social Worker, or Pharmacist.  The Complex Care Management team is here to help remove barriers to the health concerns and goals most important to you. Complex Care Management services are voluntary, and the patient may decline or stop services at any time by request to their care team member.   Complex Care Management Consent Status: Patient agreed to services and verbal consent obtained.   Follow up plan:  Telephone appointment with complex care management team member scheduled for:  09/26/2023  Encounter Outcome:  Patient Scheduled  Jeoffrey Buffalo , RMA     [place]  [facility], [facility] Guide  Direct Dial: [phone]  Website: [email or web address]

## 2023-09-26 ENCOUNTER — Ambulatory Visit

## 2023-09-26 ENCOUNTER — Telehealth: Payer: Self-pay

## 2023-10-04 ENCOUNTER — Telehealth: Payer: Self-pay

## 2023-10-04 NOTE — Progress Notes (Signed)
 Complex Care Management Care Guide Note  10/04/2023 Name: Christina Dixon MRN: [id number] DOB: [birth year]  Christina Dixon is a 18 y.o. year old female who is a primary care patient of Patient, No Pcp Per and is actively engaged with the care management team. I reached out to Christina Dixon by phone today to assist with re-scheduling  with the BSW.  Follow up plan: Unsuccessful telephone outreach attempt made. A HIPAA compliant phone message was left for the patient providing contact information and requesting a return call.  Jeoffrey Buffalo , RMA     [facility]  [facility] Guide  Direct Dial: [phone]  Website: [email or web address]

## 2023-10-10 ENCOUNTER — Other Ambulatory Visit: Payer: Self-pay

## 2023-10-10 NOTE — Patient Instructions (Signed)
 Visit Information  Thank you for taking time to visit with me today. Please don't hesitate to contact me if I can be of assistance to you before our next scheduled appointment.   Following is a copy of your care plan:   Goals Addressed             This Visit's Progress    BSW VBCI Social Work Care Plan       Problems:   Patient wants to establish with a primary care provider.  CSW Clinical Goal(s):   Over the next 3 months the Patient will attend medical appointment.  Interventions:  Social Determinants of Health in Patient with Depression: SDOH assessments completed: Patient wants to establish with a primary care provider. Evaluation of current treatment plan related to unmet needs SW t/c Physician Referral line and patient is scheduled 01/06/24 at 9:20am with [facility] Dr[facility] B[place]. Patient will address dental soon and has a dentist that is used by other family.  Patient Goals/Self-Care Activities:  Patient will attend initial office visit with [facility] and schedule dental visit.  Plan:   No further follow up required: Patient does not request a follow up.        Please call 911 if you are experiencing a Mental Health or Behavioral Health Crisis or need someone to talk to.  Patient verbalizes understanding of instructions and care plan provided today and agrees to view in MyChart. Active MyChart status and patient understanding of how to access instructions and care plan via MyChart confirmed with patient.     Tillman Gardener, BSW [place]  [facility], [facility] Social Worker Direct Dial: [phone]  Fax: [phone] Website: [email or web address]

## 2023-10-10 NOTE — Patient Outreach (Signed)
 Complex Care Management   Visit Note  10/10/2023  Name:  Christina Dixon MRN: [id number] DOB: 2005/12/27  Situation: Referral received for Complex Care Management related to SDOH Barriers:  Establish a PCP I obtained verbal consent from Patient.  Visit completed with patient  on the phone  Background:   Past Medical History:  Diagnosis Date   Cough 05/28/2014   Diabetes mellitus without complication (HCC)    diet controlled   Difficulty swallowing pills    Obesity    Stuffy nose 05/28/2014   Tonsillar and adenoid hypertrophy 05/2014   snores during sleep, occasionally stops breathing, per mother    Assessment:  Patient reports that she uses Urgent Care for medical needs.  Patient unemployed and has Medicaid.  SW t/c Physician Referral line and patient is scheduled 01/06/24 at 9:20am with [facility] Bevely. Patient will address dental soon and has a dentist that is used by other family.  Patient does not want RNCM at this time.    SDOH Interventions    Flowsheet Row Patient Outreach Telephone from 10/10/2023 in [place] POPULATION HEALTH DEPARTMENT Office Visit from 04/05/2023 in [facility] Telephone from 03/26/2022 in [place] POPULATION HEALTH DEPARTMENT Nutrition from 12/05/2014 in [facility]  - A Dept Of [place]  SDOH Interventions      Food Insecurity Interventions Intervention Not Indicated, Other (Comment)  [eat with family and not eligible for foodstamps] -- -- --  Housing Interventions Intervention Not Indicated -- -- --  Transportation Interventions Intervention Not Indicated, Patient Resources Dietitian) -- Intervention Not Indicated --  Utilities Interventions Intervention Not Indicated -- -- --  Depression Interventions/Treatment  -- -- -- Referral to Psychiatry  Financial Strain Interventions Intervention Not Indicated  [Looking for a job] Intervention Not  Indicated -- --  Social Connections Interventions -- Intervention Not Indicated -- --  Health Literacy Interventions -- Intervention Not Indicated -- --      Recommendation:   none  Follow Up Plan:   Patient has met all care management goals. Care Management case will be closed. Patient has been provided contact information should new needs arise.   Tillman Gardener, BSW [place]  [facility], [facility] Social Worker Direct Dial: [phone]  Fax: [phone] Website: [email or web address]

## 2023-10-18 ENCOUNTER — Emergency Department (HOSPITAL_COMMUNITY)

## 2023-10-18 ENCOUNTER — Emergency Department (HOSPITAL_COMMUNITY)
Admission: EM | Admit: 2023-10-18 | Discharge: 2023-10-18 | Disposition: A | Discharge status: Home / Self Care | Attending: Emergency Medicine | Admitting: Emergency Medicine

## 2023-10-18 ENCOUNTER — Other Ambulatory Visit: Payer: Self-pay

## 2023-10-18 DIAGNOSIS — B349 Viral infection, unspecified: Secondary | ICD-10-CM | POA: Diagnosis not present

## 2023-10-18 DIAGNOSIS — E119 Type 2 diabetes mellitus without complications: Secondary | ICD-10-CM | POA: Insufficient documentation

## 2023-10-18 DIAGNOSIS — R1011 Right upper quadrant pain: Secondary | ICD-10-CM | POA: Insufficient documentation

## 2023-10-18 DIAGNOSIS — R059 Cough, unspecified: Secondary | ICD-10-CM | POA: Diagnosis present

## 2023-10-18 LAB — COMPREHENSIVE METABOLIC PANEL WITH GFR
ALT: 30 U/L (ref 0–44)
AST: 27 U/L (ref 15–41)
Albumin: 3.1 g/dL — ABNORMAL LOW (ref 3.5–5.0)
Alkaline Phosphatase: 59 U/L (ref 38–126)
Anion gap: 8 (ref 5–15)
BUN: 8 mg/dL (ref 6–20)
CO2: 22 mmol/L (ref 22–32)
Calcium: 8.3 mg/dL — ABNORMAL LOW (ref 8.9–10.3)
Chloride: 107 mmol/L (ref 98–111)
Creatinine, Ser: 0.94 mg/dL (ref 0.44–1.00)
GFR, Estimated: >60 mL/min (ref >60)
Glucose, Bld: 86 mg/dL (ref 70–99)
Potassium: 3.6 mmol/L (ref 3.5–5.1)
Sodium: 137 mmol/L (ref 135–145)
Total Bilirubin: 0.3 mg/dL (ref 0.0–1.2)
Total Protein: 5.7 g/dL — ABNORMAL LOW (ref 6.5–8.1)

## 2023-10-18 LAB — CBC WITH DIFFERENTIAL/PLATELET
Abs Immature Granulocytes: 0.03 K/uL (ref 0.00–0.07)
Basophils Absolute: 0.1 K/uL (ref 0.0–0.1)
Basophils Relative: 2 %
Eosinophils Absolute: 0 K/uL (ref 0.0–0.5)
Eosinophils Relative: 1 %
HCT: 44.6 % (ref 36.0–46.0)
Hemoglobin: 14.4 g/dL (ref 12.0–15.0)
Immature Granulocytes: 1 %
Lymphocytes Relative: 45 %
Lymphs Abs: 2 K/uL (ref 0.7–4.0)
MCH: 28.4 pg (ref 26.0–34.0)
MCHC: 32.3 g/dL (ref 30.0–36.0)
MCV: 88 fL (ref 80.0–100.0)
Monocytes Absolute: 0.4 K/uL (ref 0.1–1.0)
Monocytes Relative: 9 %
Neutro Abs: 1.8 K/uL (ref 1.7–7.7)
Neutrophils Relative %: 42 %
Platelets: 166 K/uL (ref 150–400)
RBC: 5.07 MIL/uL (ref 3.87–5.11)
RDW: 13.7 % (ref 11.5–15.5)
Smear Review: NORMAL
WBC: 4.3 K/uL (ref 4.0–10.5)
nRBC: 0 % (ref 0.0–0.2)

## 2023-10-18 LAB — LIPASE, BLOOD: Lipase: 27 U/L (ref 11–51)

## 2023-10-18 LAB — URINALYSIS, ROUTINE W REFLEX MICROSCOPIC
Bilirubin Urine: NEGATIVE
Glucose, UA: NEGATIVE mg/dL
Hgb urine dipstick: NEGATIVE
Ketones, ur: NEGATIVE mg/dL
Leukocytes,Ua: NEGATIVE
Nitrite: NEGATIVE
Protein, ur: NEGATIVE mg/dL
Specific Gravity, Urine: 1.018 (ref 1.005–1.030)
pH: 6 (ref 5.0–8.0)

## 2023-10-18 LAB — RESP PANEL BY RT-PCR (RSV, FLU A&B, COVID)  RVPGX2
Influenza A by PCR: NEGATIVE
Influenza B by PCR: NEGATIVE
Resp Syncytial Virus by PCR: NEGATIVE
SARS Coronavirus 2 by RT PCR: NEGATIVE

## 2023-10-18 LAB — PREGNANCY, URINE: Preg Test, Ur: NEGATIVE

## 2023-10-18 MED ORDER — PANTOPRAZOLE SODIUM 40 MG PO TBEC
40.0000 mg | DELAYED_RELEASE_TABLET | Freq: Every day | ORAL | 0 refills | Status: DC
Start: 2023-10-18 — End: 2024-01-26

## 2023-10-18 MED ORDER — TRAMADOL HCL 50 MG PO TABS: 50.0000 mg | ORAL_TABLET | Freq: Four times a day (QID) | ORAL | 0 refills | Status: DC | PRN

## 2023-10-18 NOTE — Discharge Instructions (Signed)
 [place] diet as tolerated.  Please take the pantoprazole  as directed to help with your stomach discomfort.  Please call the general surgeon listed (Dr Kallie) to arrange follow-up regarding your abdominal pain.  I have also listed 2 of the local primary care clinics in the area that you may contact to establish primary care return to the emergency department if you develop any new worsening symptoms.

## 2023-10-18 NOTE — ED Triage Notes (Signed)
 Pt c/o generalized body aches, RUQ and LUQ pain that is cramping in nature, rates pain 10/10, some shob, cough nonproductive and one episode of diarrhea on Friday but denies anymore episodes. Denies N/V.

## 2023-10-18 NOTE — ED Provider Notes (Signed)
 [place] EMERGENCY DEPARTMENT AT [facility] Provider Note   CSN: [id number] Arrival date & time: 10/18/23  1014     Patient presents with: Generalized Body Aches   Christina Dixon is a 18 y.o. female.   HPI     Christina Dixon is a 18 y.o. female type 2 diabetes who presents to the Emergency Department complaining of epigastric right upper quadrant pain, generalized bodyaches, nonproductive cough.  Symptoms present for 4 days.  She endorses some diarrhea at onset of her symptoms but diarrhea has since resolved.  Denies having any nausea or vomiting fever or chills.  No flank pain or dysuria symptoms.  Prior to Admission medications   Medication Sig Start Date End Date Taking? Authorizing Provider  alum & mag hydroxide-simeth ([place] MAXIMUM STRENGTH) 400-400-40 MG/5ML suspension Take 15 mLs by mouth every 6 (six) hours as needed for indigestion. 07/29/23   Leath-Warren, Etta PARAS, NP  famotidine  (PEPCID ) 20 MG tablet Take 1 tablet (20 mg total) by mouth daily. 07/29/23   Leath-Warren, Etta PARAS, NP  fluconazole  (DIFLUCAN ) 150 MG tablet Take 1 tablet once then repeat in 2 days if needed 04/19/23   Ozan, Jennifer, DO  ibuprofen  (ADVIL ) 600 MG tablet Take 1 tablet (600 mg total) by mouth every 8 (eight) hours as needed. 06/07/23   Stuart Vernell Norris, PA-C  loperamide  (IMODIUM ) 2 MG capsule Take 1 capsule (2 mg total) by mouth 4 (four) times daily as needed for diarrhea or loose stools. 07/29/23   Leath-Warren, Etta PARAS, NP  metFORMIN  (GLUCOPHAGE -XR) 500 MG 24 hr tablet Take 1 tablet (500 mg total) by mouth daily with breakfast. 06/07/23   Stuart Vernell Norris, PA-C  metroNIDAZOLE  (FLAGYL ) 500 MG tablet Take 1 tablet (500 mg total) by mouth 2 (two) times daily. 04/07/23   Ozan, Jennifer, DO    Allergies: Patient has no known allergies.    Review of Systems  Constitutional:  Negative for chills and fever.  HENT:  Negative for congestion and sore throat.    Respiratory:  Positive for cough. Negative for shortness of breath.   Cardiovascular:  Negative for chest pain.  Gastrointestinal:  Positive for abdominal pain. Negative for nausea and vomiting.  Genitourinary:  Negative for dysuria and flank pain.  Musculoskeletal:  Positive for myalgias. Negative for back pain, neck pain and neck stiffness.  Skin:  Negative for rash.  Neurological:  Negative for dizziness, weakness, numbness and headaches.  Psychiatric/Behavioral:  Negative for confusion.     Updated Vital Signs BP (!) 138/94 (BP Location: Right Arm)   Pulse 76   Temp 98.8 F (37.1 C) (Oral)   Resp 19   LMP 09/28/2023   SpO2 98%   Physical Exam Vitals and nursing note reviewed.  Constitutional:      General: She is not in acute distress.    Appearance: Normal appearance. She is not toxic-appearing.  HENT:     Mouth/Throat:     Mouth: Mucous membranes are moist.  Cardiovascular:     Rate and Rhythm: Normal rate and regular rhythm.     Pulses: Normal pulses.  Pulmonary:     Effort: Pulmonary effort is normal.  Abdominal:     Palpations: Abdomen is soft.     Tenderness: There is abdominal tenderness in the right upper quadrant and epigastric area. Negative signs include McBurney's sign.     Comments: Mild tenderness palpation right upper quadrant and epigastric area.  No guarding or rebound.  Abdomen is soft  Musculoskeletal:        General: Normal range of motion.  Skin:    General: Skin is warm.     Capillary Refill: Capillary refill takes less than 2 seconds.  Neurological:     General: No focal deficit present.     Mental Status: She is alert.     Sensory: No sensory deficit.     Motor: No weakness.     (all labs ordered are listed, but only abnormal results are displayed) Labs Reviewed  URINALYSIS, ROUTINE W REFLEX MICROSCOPIC - Abnormal; Notable for the following components:      Result Value   APPearance CLOUDY (*)    All other components within normal  limits  COMPREHENSIVE METABOLIC PANEL WITH GFR - Abnormal; Notable for the following components:   Calcium 8.3 (*)    Total Protein 5.7 (*)    Albumin 3.1 (*)    All other components within normal limits  RESP PANEL BY RT-PCR (RSV, FLU A&B, COVID)  RVPGX2  CBC WITH DIFFERENTIAL/PLATELET  LIPASE, BLOOD  PREGNANCY, URINE    EKG: None   Radiology: US  Abdomen Limited Result Date: 10/18/2023 CLINICAL DATA:  Acute right upper quadrant abdominal pain for 5 days. EXAM: ULTRASOUND ABDOMEN LIMITED RIGHT UPPER QUADRANT COMPARISON:  None Available. FINDINGS: Gallbladder: No gallstones or wall thickening visualized. Positive sonographic Murphy sign noted by sonographer. Common bile duct: Diameter: 5 mm which is within normal limits. Liver: No focal lesion identified. Within normal limits in parenchymal echogenicity. Portal vein is patent on color Doppler imaging with normal direction of blood flow towards the liver. Other: None. IMPRESSION: Positive sonographic Beverley sign was noted by sonographer, but no other abnormality is noted in the right upper quadrant of the abdomen. Electronically Signed   By: Lynwood Landy Raddle M.D.   On: 10/18/2023 12:17     Procedures   Medications Ordered in the ED - No data to display                                  Medical Decision Making With generalized bodyaches, nonproductive cough and right upper quadrant and epigastric pain.  No reported fever, symptoms present for 4 days.  She has been having intermittent right upper quadrant pain for some time.  Abdominal pain is not associated with food intake, she does endorse some decreased appetite, but denies vomiting or diarrhea.  On my exam, patient is well-appearing nontoxic.  She does have some mild right upper quadrant tenderness without guarding or rebound.  Her abdomen is soft  Suspect viral process, she denies any tick bites or rash, abdominal pain possible related to gastritis, cholecystitis cholelithiasis also  considered.  Amount and/or Complexity of Data Reviewed Labs: ordered.    Details: Labs unremarkable.  Urine pregnancy negative.  Respiratory panel negative Radiology: ordered.    Details: Right upper quadrant ultrasound shows positive sonographic Murphy sign but without evidence of gallbladder wall thickening or stones. Discussion of management or test interpretation with external provider(s):  On recheck, patient resting comfortably.  I suspect her myalgias secondary to viral process.  She does have some mild right upper quadrant tenderness on my exam without leukocytosis fever or elevated LFTs.  No ultrasound evidence of stones or wall thickening.  No active vomiting and no reported nausea or vomiting for the last several days.  I feel that she is appropriate for discharge home at this time I have given  strict return precautions and have recommended close outpatient follow-up with general surgery regarding her abdominal pain.  She is agreeable to this plan will also prescribe PPI and short course of pain medication for her symptoms.  Database was reviewed.    Risk Prescription drug management.        Final diagnoses:  Viral syndrome  Right upper quadrant pain    ED Discharge Orders     None          Herlinda Milling, PA-C 10/18/23 1349    Pamella Ozell LABOR, DO 10/26/23 [phone]

## 2023-10-21 ENCOUNTER — Other Ambulatory Visit: Payer: Self-pay

## 2023-10-21 ENCOUNTER — Emergency Department (HOSPITAL_COMMUNITY)
Admission: EM | Admit: 2023-10-21 | Discharge: 2023-10-22 | Disposition: A | Discharge status: Home / Self Care | Attending: Emergency Medicine | Admitting: Emergency Medicine

## 2023-10-21 DIAGNOSIS — R112 Nausea with vomiting, unspecified: Secondary | ICD-10-CM | POA: Insufficient documentation

## 2023-10-21 DIAGNOSIS — R197 Diarrhea, unspecified: Secondary | ICD-10-CM | POA: Insufficient documentation

## 2023-10-21 DIAGNOSIS — R748 Abnormal levels of other serum enzymes: Secondary | ICD-10-CM | POA: Insufficient documentation

## 2023-10-21 DIAGNOSIS — R519 Headache, unspecified: Secondary | ICD-10-CM | POA: Diagnosis not present

## 2023-10-21 NOTE — ED Triage Notes (Addendum)
 Pt c/o headache x6 days started having N/V/D yesterday.

## 2023-10-22 ENCOUNTER — Emergency Department (HOSPITAL_COMMUNITY)

## 2023-10-22 DIAGNOSIS — R112 Nausea with vomiting, unspecified: Secondary | ICD-10-CM | POA: Diagnosis not present

## 2023-10-22 LAB — CBC
HCT: 45.4 % (ref 36.0–46.0)
Hemoglobin: 15.4 g/dL — ABNORMAL HIGH (ref 12.0–15.0)
MCH: 28.8 pg (ref 26.0–34.0)
MCHC: 33.9 g/dL (ref 30.0–36.0)
MCV: 85 fL (ref 80.0–100.0)
Platelets: 115 K/uL — ABNORMAL LOW (ref 150–400)
RBC: 5.34 MIL/uL — ABNORMAL HIGH (ref 3.87–5.11)
RDW: 13.2 % (ref 11.5–15.5)
WBC: 6.1 K/uL (ref 4.0–10.5)
nRBC: 0 % (ref 0.0–0.2)

## 2023-10-22 LAB — HEPATITIS PANEL, ACUTE
HCV Ab: NONREACTIVE
Hep A IgM: NONREACTIVE
Hep B C IgM: NONREACTIVE

## 2023-10-22 LAB — URINALYSIS, ROUTINE W REFLEX MICROSCOPIC
Bacteria, UA: NONE SEEN
Glucose, UA: NEGATIVE mg/dL
Ketones, ur: 20 mg/dL — AB
Leukocytes,Ua: NEGATIVE
Nitrite: NEGATIVE
Protein, ur: 30 mg/dL — AB
Specific Gravity, Urine: 1.023 (ref 1.005–1.030)
pH: 6 (ref 5.0–8.0)

## 2023-10-22 LAB — COMPREHENSIVE METABOLIC PANEL WITH GFR
ALT: 315 U/L — ABNORMAL HIGH (ref 0–44)
AST: 249 U/L — ABNORMAL HIGH (ref 15–41)
Albumin: 3.1 g/dL — ABNORMAL LOW (ref 3.5–5.0)
Alkaline Phosphatase: 165 U/L — ABNORMAL HIGH (ref 38–126)
Anion gap: 10 (ref 5–15)
BUN: 6 mg/dL (ref 6–20)
CO2: 23 mmol/L (ref 22–32)
Calcium: 8.4 mg/dL — ABNORMAL LOW (ref 8.9–10.3)
Chloride: 102 mmol/L (ref 98–111)
Creatinine, Ser: 0.99 mg/dL (ref 0.44–1.00)
GFR, Estimated: >60 mL/min (ref >60)
Glucose, Bld: 87 mg/dL (ref 70–99)
Potassium: 3.4 mmol/L — ABNORMAL LOW (ref 3.5–5.1)
Sodium: 135 mmol/L (ref 135–145)
Total Bilirubin: 0.8 mg/dL (ref 0.0–1.2)
Total Protein: 6.3 g/dL — ABNORMAL LOW (ref 6.5–8.1)

## 2023-10-22 LAB — POC URINE PREG, ED: Preg Test, Ur: NEGATIVE — NL

## 2023-10-22 LAB — LIPASE, BLOOD: Lipase: 25 U/L (ref 11–51)

## 2023-10-22 MED ORDER — IOHEXOL 300 MG/ML  SOLN
100.0000 mL | Freq: Once | INTRAMUSCULAR | Status: AC | PRN
  Administered 2023-10-22: 100 mL via INTRAVENOUS

## 2023-10-22 MED ORDER — LACTATED RINGERS IV BOLUS
1000.0000 mL | Freq: Once | INTRAVENOUS | Status: AC
  Administered 2023-10-22: 1000 mL via INTRAVENOUS

## 2023-10-22 MED ORDER — KETOROLAC TROMETHAMINE 30 MG/ML IJ SOLN
30.0000 mg | Freq: Once | INTRAMUSCULAR | Status: AC
  Administered 2023-10-22: 30 mg via INTRAVENOUS
  Filled 2023-10-22: qty 1

## 2023-10-22 MED ORDER — PROCHLORPERAZINE EDISYLATE 10 MG/2ML IJ SOLN
10.0000 mg | Freq: Once | INTRAMUSCULAR | Status: AC
  Administered 2023-10-22: 10 mg via INTRAVENOUS
  Filled 2023-10-22: qty 2

## 2023-10-22 MED ORDER — ONDANSETRON 4 MG PO TBDP: 4.0000 mg | ORAL_TABLET | Freq: Three times a day (TID) | ORAL | 0 refills | Status: DC | PRN

## 2023-10-22 NOTE — ED Provider Notes (Signed)
 [place] EMERGENCY DEPARTMENT AT [facility]  Provider Note  CSN: [id number] Arrival date & time: 10/21/23 2143  History Chief Complaint  Patient presents with   Headache    Christina Dixon is a 18 y.o. female reports she has had a headache for about a week. Seen in the ED on 8/12 for body aches, abd pain, dry cough. No mention of headache in that note, but she states she told them. She had more or less unremarkable workup and was ultimately discharged with likely viral syndrome. She has continued to have headaches described as frontal and throbbing. Yesterday she also began having N/V/D. Denies fever. No blood in stool.    Home Medications Prior to Admission medications   Medication Sig Start Date End Date Taking? Authorizing Provider  ondansetron  (ZOFRAN -ODT) 4 MG disintegrating tablet Take 1 tablet (4 mg total) by mouth every 8 (eight) hours as needed for nausea or vomiting. 10/22/23  Yes Roselyn Carlin NOVAK, MD  alum & mag hydroxide-simeth [facility] MAXIMUM STRENGTH) 400-400-40 MG/5ML suspension Take 15 mLs by mouth every 6 (six) hours as needed for indigestion. 07/29/23   Leath-Warren, Etta PARAS, NP  famotidine  (PEPCID ) 20 MG tablet Take 1 tablet (20 mg total) by mouth daily. 07/29/23   Leath-Warren, Etta PARAS, NP  fluconazole  (DIFLUCAN ) 150 MG tablet Take 1 tablet once then repeat in 2 days if needed 04/19/23   Ozan, Jennifer, DO  ibuprofen  (ADVIL ) 600 MG tablet Take 1 tablet (600 mg total) by mouth every 8 (eight) hours as needed. 06/07/23   Stuart Vernell Norris, PA-C  loperamide  (IMODIUM ) 2 MG capsule Take 1 capsule (2 mg total) by mouth 4 (four) times daily as needed for diarrhea or loose stools. 07/29/23   Leath-Warren, Etta PARAS, NP  metFORMIN  (GLUCOPHAGE -XR) 500 MG 24 hr tablet Take 1 tablet (500 mg total) by mouth daily with breakfast. 06/07/23   Stuart Vernell Norris, PA-C  pantoprazole  (PROTONIX ) 40 MG tablet Take 1 tablet (40 mg total) by mouth daily. 10/18/23    Triplett, Tammy, PA-C  traMADol  (ULTRAM ) 50 MG tablet Take 1 tablet (50 mg total) by mouth every 6 (six) hours as needed. 10/18/23   Triplett, Madelin, PA-C     Allergies    Patient has no known allergies.   Review of Systems   Review of Systems Please see HPI for pertinent positives and negatives  Physical Exam BP 117/77   Pulse 80   Temp 99.2 F (37.3 C) (Oral)   Resp 18   LMP 10/20/2023   SpO2 100%   Physical Exam Vitals and nursing note reviewed.  Constitutional:      Appearance: Normal appearance.  HENT:     Head: Normocephalic and atraumatic.     Nose: Nose normal.     Mouth/Throat:     Mouth: Mucous membranes are moist.  Eyes:     Extraocular Movements: Extraocular movements intact.     Conjunctiva/sclera: Conjunctivae normal.  Cardiovascular:     Rate and Rhythm: Normal rate.  Pulmonary:     Effort: Pulmonary effort is normal.     Breath sounds: Normal breath sounds.  Abdominal:     General: Abdomen is flat.     Palpations: Abdomen is soft.     Tenderness: There is no abdominal tenderness. There is no guarding.  Musculoskeletal:        General: No swelling. Normal range of motion.     Cervical back: Neck supple.  Skin:    General: Skin  is warm and dry.  Neurological:     General: No focal deficit present.     Mental Status: She is alert and oriented to person, place, and time.     Cranial Nerves: No cranial nerve deficit.     Sensory: No sensory deficit.     Motor: No weakness.  Psychiatric:        Mood and Affect: Mood normal.     ED Results / Procedures / Treatments   EKG None  Procedures Procedures  Medications Ordered in the ED Medications  ketorolac  (TORADOL ) 30 MG/ML injection 30 mg (30 mg Intravenous Given 10/22/23 0111)  prochlorperazine  (COMPAZINE ) injection 10 mg (10 mg Intravenous Given 10/22/23 0111)  lactated ringers  bolus 1,000 mL (1,000 mLs Intravenous New Bag/Given 10/22/23 0114)  iohexol  (OMNIPAQUE ) 300 MG/ML solution 100 mL  (100 mLs Intravenous Contrast Given 10/22/23 0131)    Initial Impression and Plan  Patient here with subacute headache, not sudden in onset and no other symptoms to suggest acute intracranial process. Development of N/V/D supports suspected viral syndrome. Will check labs and give Toradol /Compazine  for symptom management. IVF for comfort.   ED Course   Clinical Course as of 10/22/23 0300  Sat Oct 22, 2023  0100 CBC is unremarkable. CMP with elevated LFTs, worsened from recent ED visit where US  did not show gall stones or wall thickening.  Bilirubin and Lipase are normal. Will send for CT to eval liver. Add hepatitis panel. [CS]  0121 HCG is neg. UA without infection.  [CS]  0200 I personally viewed the images from radiology studies and agree with radiologist interpretation: CT is neg for acute process.  [CS]  0258 Patient sleeping soundly on re-evaluation. Headache resolved. No vomiting or diarrhea since arrival. She is comfortable going home. Recommend PCP follow up, RTED for any other concerns.   [CS]    Clinical Course User Index [CS] Roselyn Carlin NOVAK, MD     MDM Rules/Calculators/A&P Medical Decision Making Given presenting complaint, I considered that admission might be necessary. After review of results from ED lab and/or imaging studies, admission to the hospital is not indicated at this time.    Problems Addressed: Acute nonintractable headache, unspecified headache type: acute illness or injury Elevated liver enzymes: undiagnosed new problem with uncertain prognosis Nausea vomiting and diarrhea: acute illness or injury  Amount and/or Complexity of Data Reviewed Labs: ordered. Decision-making details documented in ED Course. Radiology: ordered and independent interpretation performed. Decision-making details documented in ED Course.  Risk Prescription drug management. Decision regarding hospitalization.     Final Clinical Impression(s) / ED Diagnoses Final  diagnoses:  Acute nonintractable headache, unspecified headache type  Nausea vomiting and diarrhea  Elevated liver enzymes    Rx / DC Orders ED Discharge Orders          Ordered    ondansetron  (ZOFRAN -ODT) 4 MG disintegrating tablet  Every 8 hours PRN        10/22/23 0259             Roselyn Carlin NOVAK, MD 10/22/23 0300

## 2023-11-03 ENCOUNTER — Ambulatory Visit: Admitting: General Surgery

## 2023-11-18 ENCOUNTER — Other Ambulatory Visit: Payer: Self-pay

## 2023-11-18 ENCOUNTER — Encounter: Payer: Self-pay | Admitting: Emergency Medicine

## 2023-11-18 ENCOUNTER — Ambulatory Visit
Admission: EM | Admit: 2023-11-18 | Discharge: 2023-11-18 | Disposition: A | Discharge status: Home / Self Care | Attending: Nurse Practitioner | Admitting: Nurse Practitioner

## 2023-11-18 DIAGNOSIS — R059 Cough, unspecified: Secondary | ICD-10-CM

## 2023-11-18 DIAGNOSIS — J208 Acute bronchitis due to other specified organisms: Secondary | ICD-10-CM

## 2023-11-18 LAB — POC COVID19/FLU A&B COMBO
Covid Antigen, POC: NEGATIVE
Influenza A Antigen, POC: NEGATIVE
Influenza B Antigen, POC: NEGATIVE

## 2023-11-18 MED ORDER — ALBUTEROL SULFATE (2.5 MG/3ML) 0.083% IN NEBU
2.5000 mg | INHALATION_SOLUTION | Freq: Once | RESPIRATORY_TRACT | Status: AC
  Administered 2023-11-18: 2.5 mg via RESPIRATORY_TRACT

## 2023-11-18 MED ORDER — PROMETHAZINE-DM 6.25-15 MG/5ML PO SYRP: 5.0000 mL | ORAL_SOLUTION | Freq: Four times a day (QID) | ORAL | 0 refills | Status: DC | PRN

## 2023-11-18 MED ORDER — PREDNISONE 20 MG PO TABS: 40.0000 mg | ORAL_TABLET | Freq: Every day | ORAL | 0 refills | Status: AC

## 2023-11-18 MED ORDER — ALBUTEROL SULFATE HFA 108 (90 BASE) MCG/ACT IN AERS
2.0000 | INHALATION_SPRAY | Freq: Four times a day (QID) | RESPIRATORY_TRACT | 0 refills | Status: DC | PRN
Start: 2023-11-18 — End: 2024-01-26

## 2023-11-18 NOTE — Discharge Instructions (Addendum)
 The COVID/flu test was negative. Take medication as prescribed. Increase fluids and allow for plenty of rest. You may take over-the-counter Tylenol  or ibuprofen  as needed for pain, fever, or general discomfort. Recommend use of a humidifier in your bedroom at nighttime during sleep and sleeping elevated on pillows while cough symptoms persist. Symptoms should improve over the next 7 to 10 days.  If you continue to have a persistent nagging cough, but are generally feeling well, continue use of over-the-counter cough medications.  Seek care if you experience continued wheezing, shortness of breath, or difficulty breathing. Follow-up as needed.

## 2023-11-18 NOTE — ED Provider Notes (Signed)
 [facility]    CSN: [id number] Arrival date & time: 11/18/23  1557      History   Chief Complaint Chief Complaint  Patient presents with   Cough    HPI Christina Dixon is a 18 y.o. female.   The history is provided by the patient.   Patient presents with a 2-day history of chills, body aches, cough, wheezing, diarrhea.  Patient denies fever, headache, ear pain, nasal congestion, runny nose, abdominal pain, nausea, or vomiting.  States so far she has been taking DayQuil for her symptoms.  States that she took a home COVID test which was negative, she denies any close sick contacts.  Past Medical History:  Diagnosis Date   Cough 05/28/2014   Diabetes mellitus without complication (HCC)    diet controlled   Difficulty swallowing pills    Obesity    Stuffy nose 05/28/2014   Tonsillar and adenoid hypertrophy 05/2014   snores during sleep, occasionally stops breathing, per mother    Patient Active Problem List   Diagnosis Date Noted   Irregular menstrual cycle 11/05/2019   S/P laparoscopic appendectomy 08/08/2017   Acute appendicitis    Prediabetes 01/24/2015   Adjustment disorder with depressed mood 11/21/2014   Severe obesity due to excess calories without serious comorbidity with body mass index (BMI) greater than 99th percentile for age in pediatric patient (HCC) 10/17/2014   Acanthosis nigricans 10/17/2014   Dyspepsia 10/17/2014   Insulin resistance 10/17/2014   Mixed hyperlipidemia 10/17/2014   Hyperinsulinemia 10/04/2014   Lightheadedness 10/04/2014   Other chest pain 10/04/2014    Past Surgical History:  Procedure Laterality Date   LAPAROSCOPIC APPENDECTOMY N/A 08/08/2017   Procedure: APPENDECTOMY LAPAROSCOPIC;  Surgeon: Mavis Anes, MD;  Location: [facility];  Service: General;  Laterality: N/A;   TONSILLECTOMY AND ADENOIDECTOMY Bilateral 06/04/2014   Procedure: BILATERAL TONSILLECTOMY AND ADENOIDECTOMY;  Surgeon: Daniel Moccasin, MD;  Location: [facility];  Service: ENT;  Laterality: Bilateral;    OB History     Gravida  0   Para  0   Term  0   Preterm  0   AB  0   Living  0      SAB  0   IAB  0   Ectopic  0   Multiple  0   Live Births  0            Home Medications    Prior to Admission medications   Medication Sig Start Date End Date Taking? Authorizing Provider  albuterol  (VENTOLIN  HFA) 108 (90 Base) MCG/ACT inhaler Inhale 2 puffs into the lungs every 6 (six) hours as needed. 11/18/23  Yes Leath-Warren, Etta PARAS, NP  predniSONE  (DELTASONE ) 20 MG tablet Take 2 tablets (40 mg total) by mouth daily with breakfast for 5 days. 11/18/23 11/23/23 Yes Leath-Warren, Etta PARAS, NP  promethazine -dextromethorphan (PROMETHAZINE -DM) 6.25-15 MG/5ML syrup Take 5 mLs by mouth 4 (four) times daily as needed for cough. 11/18/23  Yes Leath-Warren, Etta PARAS, NP  alum & mag hydroxide-simeth (MYLANTA MAXIMUM STRENGTH) 400-400-40 MG/5ML suspension Take 15 mLs by mouth every 6 (six) hours as needed for indigestion. 07/29/23   Leath-Warren, Etta PARAS, NP  famotidine  (PEPCID ) 20 MG tablet Take 1 tablet (20 mg total) by mouth daily. 07/29/23   Leath-Warren, Etta PARAS, NP  fluconazole  (DIFLUCAN ) 150 MG tablet Take 1 tablet once then repeat in 2 days if needed 04/19/23   Ozan, Jennifer, DO  ibuprofen  (ADVIL ) 600 MG tablet  Take 1 tablet (600 mg total) by mouth every 8 (eight) hours as needed. 06/07/23   Stuart Vernell Norris, PA-C  loperamide  (IMODIUM ) 2 MG capsule Take 1 capsule (2 mg total) by mouth 4 (four) times daily as needed for diarrhea or loose stools. 07/29/23   Leath-Warren, Etta PARAS, NP  metFORMIN  (GLUCOPHAGE -XR) 500 MG 24 hr tablet Take 1 tablet (500 mg total) by mouth daily with breakfast. 06/07/23   Stuart Vernell Norris, PA-C  ondansetron  (ZOFRAN -ODT) 4 MG disintegrating tablet Take 1 tablet (4 mg total) by mouth every 8 (eight) hours as needed for nausea or vomiting. 10/22/23   Roselyn Carlin NOVAK, MD  pantoprazole   (PROTONIX ) 40 MG tablet Take 1 tablet (40 mg total) by mouth daily. 10/18/23   Triplett, Tammy, PA-C  traMADol  (ULTRAM ) 50 MG tablet Take 1 tablet (50 mg total) by mouth every 6 (six) hours as needed. 10/18/23   Herlinda Milling, PA-C    Family History Family History  Problem Relation Age of Onset   Diabetes Father    Hypertension Father    Asthma Father    Seizures Father    Hypertension Maternal Grandmother    Hypertension Maternal Grandfather    Diabetes Paternal Grandfather    Hypertension Paternal Grandfather    Depression Mother    Anxiety disorder Mother    Hypertension Paternal Grandmother    Asthma Brother    Migraines Brother    Migraines Maternal Aunt    Bipolar disorder Neg Hx    Schizophrenia Neg Hx    ADD / ADHD Neg Hx    Autism Neg Hx     Social History Social History   Tobacco Use   Smoking status: Never    Passive exposure: Yes   Smokeless tobacco: Never   Tobacco comments:    mother smokes outside most of the time  Vaping Use   Vaping status: Never Used  Substance Use Topics   Alcohol use: No   Drug use: No     Allergies   Patient has no known allergies.   Review of Systems Review of Systems Per HPI  Physical Exam Triage Vital Signs ED Triage Vitals  Encounter Vitals Group     BP 11/18/23 1606 116/70     Girls Systolic BP Percentile --      Girls Diastolic BP Percentile --      Boys Systolic BP Percentile --      Boys Diastolic BP Percentile --      Pulse Rate 11/18/23 1606 82     Resp 11/18/23 1606 20     Temp 11/18/23 1606 99 F (37.2 C)     Temp Source 11/18/23 1606 Oral     SpO2 11/18/23 1606 96 %     Weight --      Height --      Head Circumference --      Peak Flow --      Pain Score 11/18/23 1607 8     Pain Loc --      Pain Education --      Exclude from Growth Chart --    No data found.  Updated Vital Signs BP 116/70 (BP Location: Right Arm)   Pulse 82   Temp 99 F (37.2 C) (Oral)   Resp 20   LMP 11/18/2023  (Approximate)   SpO2 96%   Visual Acuity Right Eye Distance:   Left Eye Distance:   Bilateral Distance:    Right Eye Near:   Left  Eye Near:    Bilateral Near:     Physical Exam Vitals and nursing note reviewed.  Constitutional:      General: She is not in acute distress.    Appearance: Normal appearance.  HENT:     Head: Normocephalic.     Right Ear: Tympanic membrane, ear canal and external ear normal.     Left Ear: Tympanic membrane, ear canal and external ear normal.     Nose: Congestion present.     Mouth/Throat:     Mouth: Mucous membranes are moist.  Eyes:     Extraocular Movements: Extraocular movements intact.     Conjunctiva/sclera: Conjunctivae normal.     Pupils: Pupils are equal, round, and reactive to light.  Cardiovascular:     Rate and Rhythm: Normal rate and regular rhythm.     Pulses: Normal pulses.     Heart sounds: Normal heart sounds.  Pulmonary:     Effort: Pulmonary effort is normal.     Breath sounds: No stridor. Examination of the right-upper field reveals wheezing. Examination of the left-upper field reveals wheezing. Decreased breath sounds and wheezing (Expiratory wheezing noted in the anterior left upper lobe and right upper lobe.) present. No rhonchi or rales.  Chest:     Chest wall: No tenderness.  Abdominal:     General: Bowel sounds are normal.     Palpations: Abdomen is soft.     Tenderness: There is no abdominal tenderness.  Musculoskeletal:     Cervical back: Normal range of motion.  Skin:    General: Skin is warm and dry.  Neurological:     General: No focal deficit present.     Mental Status: She is alert and oriented to person, place, and time.  Psychiatric:        Mood and Affect: Mood normal.        Behavior: Behavior normal.      UC Treatments / Results  Labs (all labs ordered are listed, but only abnormal results are displayed) Labs Reviewed  POC COVID19/FLU A&B COMBO    EKG   Radiology No results  found.  Procedures Procedures (including critical care time)  Medications Ordered in UC Medications  albuterol  (PROVENTIL ) (2.5 MG/3ML) 0.083% nebulizer solution 2.5 mg (2.5 mg Nebulization Given 11/18/23 1629)    Initial Impression / Assessment and Plan / UC Course  I have reviewed the triage vital signs and the nursing notes.  Pertinent labs & imaging results that were available during my care of the patient were reviewed by me and considered in my medical decision making (see chart for details).  COVID/flu test was negative.  On exam, the patient did experience expiratory wheezing, nebulizer treatment was administered with good resolution of symptoms.  Symptoms consistent with viral bronchitis.  Will treat with prednisone  40 mg for the next 5 days along with promethazine  DM for the cough, and an albuterol  inhaler as needed for wheezing..  Supportive care recommendations were provided and discussed with the patient to include fluids, rest, over-the-counter analgesics, and use of a humidifier during sleep.  Discussed indications with patient regarding follow-up.  Patient was in agreement with this plan of care and verbalizes understanding.  All questions were answered.  Patient stable for discharge.  Final Clinical Impressions(s) / UC Diagnoses   Final diagnoses:  Cough, unspecified type  Viral bronchitis     Discharge Instructions      The COVID/flu test was negative. Take medication as prescribed. Increase fluids and allow  for plenty of rest. You may take over-the-counter Tylenol  or ibuprofen  as needed for pain, fever, or general discomfort. Recommend use of a humidifier in your bedroom at nighttime during sleep and sleeping elevated on pillows while cough symptoms persist. Symptoms should improve over the next 7 to 10 days.  If you continue to have a persistent nagging cough, but are generally feeling well, continue use of over-the-counter cough medications.  Seek care if you  experience continued wheezing, shortness of breath, or difficulty breathing. Follow-up as needed.     ED Prescriptions     Medication Sig Dispense Auth. Provider   predniSONE  (DELTASONE ) 20 MG tablet Take 2 tablets (40 mg total) by mouth daily with breakfast for 5 days. 10 tablet Leath-Warren, Etta PARAS, NP   albuterol  (VENTOLIN  HFA) 108 (90 Base) MCG/ACT inhaler Inhale 2 puffs into the lungs every 6 (six) hours as needed. 8 g Leath-Warren, Etta PARAS, NP   promethazine -dextromethorphan (PROMETHAZINE -DM) 6.25-15 MG/5ML syrup Take 5 mLs by mouth 4 (four) times daily as needed for cough. 118 mL Leath-Warren, Etta PARAS, NP      PDMP not reviewed this encounter.   Gilmer Etta PARAS, NP 11/18/23 1648

## 2023-11-18 NOTE — ED Triage Notes (Addendum)
 Pt reports cough, diarrhea, chills, body aches since Wednesday. Pt inquiring about covid/flu test. Home covid test negative. Reports has tried dayquil with minimal effect on symptoms.

## 2023-12-05 ENCOUNTER — Ambulatory Visit: Payer: Self-pay

## 2024-01-06 ENCOUNTER — Ambulatory Visit: Payer: Self-pay

## 2024-01-26 ENCOUNTER — Other Ambulatory Visit: Payer: Self-pay | Admitting: Adult Health

## 2024-01-26 ENCOUNTER — Ambulatory Visit (INDEPENDENT_AMBULATORY_CARE_PROVIDER_SITE_OTHER): Admitting: *Deleted

## 2024-01-26 DIAGNOSIS — Z3201 Encounter for pregnancy test, result positive: Secondary | ICD-10-CM | POA: Diagnosis not present

## 2024-01-26 LAB — POCT URINE PREGNANCY: Preg Test, Ur: POSITIVE — AB

## 2024-01-26 MED ORDER — PRENATAL PLUS 27-1 MG PO TABS: 1.0000 | ORAL_TABLET | Freq: Every day | ORAL | 12 refills | Status: AC

## 2024-01-26 NOTE — Progress Notes (Signed)
   NURSE VISIT- PREGNANCY CONFIRMATION   SUBJECTIVE:  Christina Dixon is a 18 y.o. G28P0000 female at [redacted]w[redacted]d by certain LMP of Patient's last menstrual period was 12/17/2023. Here for pregnancy confirmation.  Home pregnancy test: positive x 1  She reports no complaints.  She is not taking prenatal vitamins.    OBJECTIVE:  LMP 12/17/2023   Appears well, in no apparent distress  Results for orders placed or performed in visit on 01/26/24 (from the past 24 hours)  POCT urine pregnancy   Collection Time: 01/26/24  2:57 PM  Result Value Ref Range   Preg Test, Ur Positive (A) Negative    ASSESSMENT: Positive pregnancy test, [redacted]w[redacted]d by LMP    PLAN: Schedule for dating ultrasound in 2 weeks Prenatal vitamins: note routed to [facility] to send prescription   Nausea medicines: not currently needed   OB packet given: Yes  Alan LITTIE Fischer  01/26/2024 3:00 PM

## 2024-01-26 NOTE — Progress Notes (Signed)
Rx sent for PNV.

## 2024-02-08 ENCOUNTER — Other Ambulatory Visit: Payer: Self-pay | Admitting: Obstetrics & Gynecology

## 2024-02-08 DIAGNOSIS — O3680X0 Pregnancy with inconclusive fetal viability, not applicable or unspecified: Secondary | ICD-10-CM

## 2024-02-09 ENCOUNTER — Other Ambulatory Visit: Payer: Self-pay | Admitting: Obstetrics & Gynecology

## 2024-02-09 ENCOUNTER — Other Ambulatory Visit

## 2024-02-09 DIAGNOSIS — O3680X0 Pregnancy with inconclusive fetal viability, not applicable or unspecified: Secondary | ICD-10-CM

## 2024-02-09 DIAGNOSIS — Z3A01 Less than 8 weeks gestation of pregnancy: Secondary | ICD-10-CM | POA: Diagnosis not present

## 2024-02-09 NOTE — Progress Notes (Signed)
 US  TV 6+1 wks,single IUP with yolk sac,CRL 4.99 mm,no fetal heart tones visualized,gestational sac 17.1 mm,normal ovaries  Chaperone Santana

## 2024-02-15 ENCOUNTER — Other Ambulatory Visit: Payer: Self-pay | Admitting: Obstetrics & Gynecology

## 2024-02-15 DIAGNOSIS — Z6841 Body Mass Index (BMI) 40.0 and over, adult: Secondary | ICD-10-CM

## 2024-02-15 DIAGNOSIS — O3680X0 Pregnancy with inconclusive fetal viability, not applicable or unspecified: Secondary | ICD-10-CM

## 2024-02-20 ENCOUNTER — Other Ambulatory Visit: Admitting: Radiology

## 2024-02-20 ENCOUNTER — Ambulatory Visit: Admitting: Adult Health

## 2024-02-20 ENCOUNTER — Encounter: Payer: Self-pay | Admitting: Adult Health

## 2024-02-20 VITALS — BP 117/71 | HR 87 | Ht 68.0 in | Wt 298.5 lb

## 2024-02-20 DIAGNOSIS — Z6841 Body Mass Index (BMI) 40.0 and over, adult: Secondary | ICD-10-CM

## 2024-02-20 DIAGNOSIS — O039 Complete or unspecified spontaneous abortion without complication: Secondary | ICD-10-CM | POA: Insufficient documentation

## 2024-02-20 DIAGNOSIS — Z3A00 Weeks of gestation of pregnancy not specified: Secondary | ICD-10-CM | POA: Diagnosis not present

## 2024-02-20 DIAGNOSIS — O021 Missed abortion: Secondary | ICD-10-CM | POA: Diagnosis not present

## 2024-02-20 DIAGNOSIS — O3680X0 Pregnancy with inconclusive fetal viability, not applicable or unspecified: Secondary | ICD-10-CM

## 2024-02-20 NOTE — Progress Notes (Signed)
°  Subjective:     Patient ID: Christina Dixon, female   DOB: 17-Jan-2006, 18 y.o.   MRN: [id number]  HPI Christina Dixon is a 18 year old black female,single, G1P0, in for follow up US , on last US  02/09/24 size<dates and no FHT., denies any recent bleeding.  Review of Systems Denies any bleeding Reviewed past medical,surgical, social and family history. Reviewed medications and allergies.     Objective:   Physical Exam BP 117/71 (BP Location: Right Arm, Patient Position: Sitting, Cuff Size: Large)   Pulse 87   Ht 5' 8 (1.727 m)   Wt 298 lb 8 oz (135.4 kg)   LMP 12/17/2023   BMI 45.39 kg/m     Skin warm and dry. Lungs: clear to ausculation bilaterally. Cardiovascular: regular rate and rhythm.  US  reviewed : no growth CRL 4.5 mm, 6+1 weeks and no FHM and YS is collapsing.Ovaries are normal.    Upstream - 02/20/24 1209       Pregnancy Intention Screening   Does the patient want to become pregnant in the next year? N/A    Does the patient's partner want to become pregnant in the next year? N/A    Would the patient like to discuss contraceptive options today? N/A      Contraception Wrap Up   Current Method Pregnant/Seeking Pregnancy    End Method Pregnant/Seeking Pregnancy    Contraception Counseling Provided No           Assessment:     1. Miscarriage(failed pregnancy, no growth, no FHM) (Primary) No growth on US , no FHT and collapsing YS Will check baseline QHCG  Discussed options of watching to see what her body does or Cytotec, will watch for now, if starts bleeding can let us  know  - Beta hCG quant (ref lab)    Offered my sympathy  Plan:     Follow up in 1 week with CNM

## 2024-02-20 NOTE — Progress Notes (Signed)
 US : GA = 9+2 weeks  Anteflexed uterus with single early IUP.  GS intact within upper mid uterine cavity, CRL =4.5 mm = 6+1 weeks,  Yolk Sac appears small and collasped = 2.9 mm, avascular embryo, no growth compared to last US  02/09/24.   6+1 week MAB Normal ov's,  small collasped Rt ovarian CL = 17 x 9 mm, Neg adnexal regions - neg CDS - no free fluid present  Trans-abdominal and Trans-vaginal exams performed Chaperone: Santana

## 2024-02-27 ENCOUNTER — Ambulatory Visit: Admitting: Women's Health
